# Patient Record
Sex: Female | Born: 2000 | Race: Black or African American | Hispanic: No | Marital: Single | Smoking: Former smoker
Health system: Southern US, Community
[De-identification: ages and names within clinical notes are randomized; demographics above are authoritative.]

## PROBLEM LIST (undated history)

## (undated) ENCOUNTER — Inpatient Hospital Stay (HOSPITAL_COMMUNITY): Payer: Self-pay

## (undated) DIAGNOSIS — J45909 Unspecified asthma, uncomplicated: Secondary | ICD-10-CM

## (undated) HISTORY — PX: TYMPANOSTOMY TUBE PLACEMENT: SHX32

## (undated) HISTORY — PX: TONSILLECTOMY: SUR1361

---
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
## 2019-02-21 ENCOUNTER — Encounter (HOSPITAL_COMMUNITY): Payer: Self-pay | Admitting: Emergency Medicine

## 2019-02-21 ENCOUNTER — Other Ambulatory Visit: Payer: Self-pay

## 2019-02-21 ENCOUNTER — Emergency Department (HOSPITAL_COMMUNITY)
Admission: EM | Admit: 2019-02-21 | Discharge: 2019-02-21 | Disposition: A | Discharge status: Home / Self Care | Payer: 59 | Attending: Emergency Medicine | Admitting: Emergency Medicine

## 2019-02-21 DIAGNOSIS — Z5321 Procedure and treatment not carried out due to patient leaving prior to being seen by health care provider: Secondary | ICD-10-CM | POA: Insufficient documentation

## 2019-02-21 DIAGNOSIS — R1032 Left lower quadrant pain: Secondary | ICD-10-CM | POA: Diagnosis present

## 2019-02-21 LAB — COMPREHENSIVE METABOLIC PANEL
ALT: 20 U/L (ref 0–44)
AST: 17 U/L (ref 15–41)
Albumin: 4.2 g/dL (ref 3.5–5.0)
Alkaline Phosphatase: 95 U/L (ref 38–126)
Anion gap: 7 (ref 5–15)
BUN: 14 mg/dL (ref 6–20)
CO2: 27 mmol/L (ref 22–32)
Calcium: 8.7 mg/dL — ABNORMAL LOW (ref 8.9–10.3)
Chloride: 106 mmol/L (ref 98–111)
Creatinine, Ser: 0.68 mg/dL (ref 0.44–1.00)
GFR calc Af Amer: >60 mL/min (ref >60)
GFR calc non Af Amer: >60 mL/min (ref >60)
Glucose, Bld: 110 mg/dL — ABNORMAL HIGH (ref 70–99)
Potassium: 3.6 mmol/L (ref 3.5–5.1)
Sodium: 140 mmol/L (ref 135–145)
Total Bilirubin: 0.9 mg/dL (ref 0.3–1.2)
Total Protein: 7 g/dL (ref 6.5–8.1)

## 2019-02-21 LAB — I-STAT BETA HCG BLOOD, ED (MC, WL, AP ONLY): I-stat hCG, quantitative: <5 m[IU]/mL (ref <5)

## 2019-02-21 LAB — CBC
HCT: 43 % (ref 36.0–46.0)
Hemoglobin: 14.1 g/dL (ref 12.0–15.0)
MCH: 29.8 pg (ref 26.0–34.0)
MCHC: 32.8 g/dL (ref 30.0–36.0)
MCV: 90.9 fL (ref 80.0–100.0)
Platelets: 198 10*3/uL (ref 150–400)
RBC: 4.73 MIL/uL (ref 3.87–5.11)
RDW: 13.5 % (ref 11.5–15.5)
WBC: 9.4 10*3/uL (ref 4.0–10.5)
nRBC: 0 % (ref 0.0–0.2)

## 2019-02-21 LAB — URINALYSIS, ROUTINE W REFLEX MICROSCOPIC
Bacteria, UA: NONE SEEN
Bilirubin Urine: NEGATIVE
Glucose, UA: NEGATIVE mg/dL
Hgb urine dipstick: NEGATIVE
Ketones, ur: NEGATIVE mg/dL
Leukocytes,Ua: NEGATIVE
Nitrite: NEGATIVE
Protein, ur: 30 mg/dL — AB
Specific Gravity, Urine: 1.034 — ABNORMAL HIGH (ref 1.005–1.030)
pH: 5 (ref 5.0–8.0)

## 2019-02-21 LAB — LIPASE, BLOOD: Lipase: 30 U/L (ref 11–51)

## 2019-02-21 NOTE — ED Triage Notes (Signed)
Per pt, states LL abdominal pain for 3 days-states pain is worse in the am-no N/V/D

## 2019-02-21 NOTE — ED Notes (Signed)
Pt called requesting lab results and voicing concerns about wait time.  Pt informed we cannot give results over the phone and attempted to educate on triage process.  Pt stated she had to leave d/t work schedule.  Pt asked about virtual visits and provided information.  Pt hung up on this Probation officer.

## 2019-02-21 NOTE — ED Notes (Signed)
Patient reports she is going to wait in her car as she does not want to wait in the waiting room due to [place].

## 2019-04-07 ENCOUNTER — Ambulatory Visit (HOSPITAL_COMMUNITY)
Admission: EM | Admit: 2019-04-07 | Discharge: 2019-04-07 | Disposition: A | Discharge status: Home / Self Care | Payer: 59 | Attending: Family Medicine | Admitting: Family Medicine

## 2019-04-07 ENCOUNTER — Encounter (HOSPITAL_COMMUNITY): Payer: Self-pay

## 2019-04-07 ENCOUNTER — Other Ambulatory Visit: Payer: Self-pay

## 2019-04-07 DIAGNOSIS — Z113 Encounter for screening for infections with a predominantly sexual mode of transmission: Secondary | ICD-10-CM | POA: Diagnosis present

## 2019-04-07 DIAGNOSIS — J4520 Mild intermittent asthma, uncomplicated: Secondary | ICD-10-CM | POA: Diagnosis present

## 2019-04-07 DIAGNOSIS — Z760 Encounter for issue of repeat prescription: Secondary | ICD-10-CM | POA: Diagnosis not present

## 2019-04-07 DIAGNOSIS — Z8619 Personal history of other infectious and parasitic diseases: Secondary | ICD-10-CM

## 2019-04-07 MED ORDER — ALBUTEROL SULFATE HFA 108 (90 BASE) MCG/ACT IN AERS: 1.0000 | INHALATION_SPRAY | Freq: Four times a day (QID) | RESPIRATORY_TRACT | 0 refills | Status: DC | PRN

## 2019-04-07 NOTE — Discharge Instructions (Signed)
Inhaler refilled  We are testing you for Gonorrhea, Chlamydia, Trichomonas, Yeast and Bacterial Vaginosis. We will call you if anything is positive and let you know if you require any further treatment. Please inform partners of any positive results.   Please return if symptoms not improving with treatment, development of fever, nausea, vomiting, abdominal pain.

## 2019-04-07 NOTE — ED Triage Notes (Signed)
Pt needs a refill on her inhaler and would like to have a STD screening. Pt denies any symptoms at this time but would like to be tested.

## 2019-04-07 NOTE — ED Provider Notes (Signed)
[facility]    CSN: [id number] Arrival date & time: 04/07/19  1535      History   Chief Complaint Chief Complaint  Patient presents with   Medication Refill   Exposure to STD    HPI Patricia [name] is a 19 y.o. female history of asthma presenting today for medication refill and STD screening.  Patient states that she feels her asthma has been flared up recently and has ran out of her albuterol inhaler.  She is requesting a refill of this.  She notes that with the weather changing she has been more congested in the morning with occasional shortness of breath and wheezing.  Symptoms are intermittent typically improved with albuterol inhaler.  She denies any fevers chills or body aches.  Denies any close sick contacts or known Covid exposure.  Feels similar to prior asthma flares.  She also is requesting STD screening.  She notes that previously when she had chlamydia she had menstrual cycles that would last longer than normal.  Of recently she has noticed increased bleeding and cycles.  Patient uses Nexplanon for birth control and notes that it is normal to have irregular cycles, but she recently had an episode of bleeding that lasted approximately 5 days and stopped and then has recently returned.  She denies any abnormal discharge.  Denies any pelvic pain.  Denies any known exposures.  Denies any fevers nausea or vomiting.  HPI  Past Medical History:  Diagnosis Date   Asthma     There are no problems to display for this patient.   Past Surgical History:  Procedure Laterality Date   TONSILLECTOMY     TYMPANOSTOMY TUBE PLACEMENT      OB History   No obstetric history on file.      Home Medications    Prior to Admission medications   Medication Sig Start Date End Date Taking? Authorizing Provider  albuterol (PROVENTIL) (2.5 MG/3ML) 0.083% nebulizer solution Take 3 mLs (2.5 mg total) by nebulization every 6 (six) hours as needed for up to 5 days for  wheezing or shortness of breath. 04/15/17 12/19/27  Elowen Debruyn C, PA-C  albuterol (VENTOLIN HFA) 108 (90 Base) MCG/ACT inhaler Inhale 1-2 puffs into the lungs every 6 (six) hours as needed for wheezing or shortness of breath. 04/07/19   Darron Stuck C, PA-C  Etonogestrel (NEXPLANON [place]) Inject into the skin once.     [provider]  fluticasone (FLONASE) 50 MCG/ACT nasal spray Place 2 sprays into both nostrils daily. Patient taking differently: Place 2 sprays into both nostrils daily as needed for allergies.  06/02/17   Tasia Catchings, Amy V, PA-C  loratadine (CLARITIN) 10 MG tablet Take 1 tablet (10 mg total) by mouth daily. Patient taking differently: Take 10 mg by mouth daily as needed for allergies.  12/07/11 12/19/27  Awilda Metro, NP    Family History History reviewed. No pertinent family history.  Social History Social History   Tobacco Use   Smoking status: Never Smoker   Smokeless tobacco: Never Used  Substance Use Topics   Alcohol use: No   Drug use: No     Allergies   Patient has no known allergies.   Review of Systems Review of Systems  Constitutional: Negative for activity change, appetite change, chills, fatigue and fever.  HENT: Positive for congestion. Negative for ear pain, rhinorrhea, sinus pressure, sore throat and trouble swallowing.   Eyes: Negative for discharge and redness.  Respiratory: Positive for cough, shortness  of breath and wheezing. Negative for chest tightness.   Cardiovascular: Negative for chest pain.  Gastrointestinal: Negative for abdominal pain, diarrhea, nausea and vomiting.  Genitourinary: Positive for menstrual problem. Negative for dysuria, flank pain, genital sores, hematuria, vaginal bleeding, vaginal discharge and vaginal pain.  Musculoskeletal: Negative for back pain and myalgias.  Skin: Negative for rash.  Neurological: Negative for dizziness, light-headedness and headaches.     Physical Exam Triage Vital Signs ED Triage  Vitals [04/07/19 1602]  Enc Vitals Group     BP 122/74     Pulse Rate 77     Resp 16     Temp 98.1 F (36.7 C)     Temp Source Oral     SpO2 98 %     Weight      Height      Head Circumference      Peak Flow      Pain Score 0     Pain Loc      Pain Edu?      Excl. in GC?    No data found.  Updated Vital Signs BP 122/74 (BP Location: Left Arm)    Pulse 77    Temp 98.1 F (36.7 C) (Oral)    Resp 16    SpO2 98%   Visual Acuity Right Eye Distance:   Left Eye Distance:   Bilateral Distance:    Right Eye Near:   Left Eye Near:    Bilateral Near:     Physical Exam Vitals and nursing note reviewed.  Constitutional:      General: She is not in acute distress.    Appearance: She is well-developed.  HENT:     Head: Normocephalic and atraumatic.     Ears:     Comments: Bilateral ears without tenderness to palpation of external auricle, tragus and mastoid, EAC's without erythema or swelling, TM's with good bony landmarks and cone of light. Non erythematous.    Mouth/Throat:     Comments: Oral mucosa pink and moist, no tonsillar enlargement or exudate. Posterior pharynx patent and nonerythematous, no uvula deviation or swelling. Normal phonation. Eyes:     Conjunctiva/sclera: Conjunctivae normal.  Cardiovascular:     Rate and Rhythm: Normal rate and regular rhythm.     Heart sounds: No murmur.  Pulmonary:     Effort: Pulmonary effort is normal. No respiratory distress.     Breath sounds: Normal breath sounds.     Comments: Breathing comfortably at rest, CTABL, no wheezing, rales or other adventitious sounds auscultated Abdominal:     Palpations: Abdomen is soft.     Tenderness: There is no abdominal tenderness.  Genitourinary:    Comments: Deferred Musculoskeletal:     Cervical back: Neck supple.  Skin:    General: Skin is warm and dry.  Neurological:     Mental Status: She is alert.      UC Treatments / Results  Labs (all labs ordered are listed, but only  abnormal results are displayed) Labs Reviewed  CERVICOVAGINAL ANCILLARY ONLY    EKG   Radiology No results found.  Procedures Procedures (including critical care time)  Medications Ordered in UC Medications - No data to display  Initial Impression / Assessment and Plan / UC Course  I have reviewed the triage vital signs and the nursing notes.  Pertinent labs & imaging results that were available during my care of the patient were reviewed by me and considered in my medical decision making (see chart  for details).     Refilling albuterol inhaler, will defer steroids at this time as it is currently being controlled with inhaler alone.  Advised patient to continue to monitor.  Vaginal swab obtained to send off for STD screening.  Deferring any empiric treatment at this time.  Will call with results and provide treatment as necessary.  Discussed strict return precautions. Patient verbalized understanding and is agreeable with plan.    Final Clinical Impressions(s) / UC Diagnoses   Final diagnoses:  Medication refill  Mild intermittent asthma without complication  Screen for STD (sexually transmitted disease)     Discharge Instructions     Inhaler refilled  We are testing you for Gonorrhea, Chlamydia, Trichomonas, Yeast and Bacterial Vaginosis. We will call you if anything is positive and let you know if you require any further treatment. Please inform partners of any positive results.   Please return if symptoms not improving with treatment, development of fever, nausea, vomiting, abdominal pain.    ED Prescriptions    Medication Sig Dispense Auth. Provider   albuterol (VENTOLIN HFA) 108 (90 Base) MCG/ACT inhaler Inhale 1-2 puffs into the lungs every 6 (six) hours as needed for wheezing or shortness of breath. 18 g [place] Borchardt, [place] C, PA-C     PDMP not reviewed this encounter.   Lew Dawes, [place] 04/07/19 1949

## 2019-04-11 ENCOUNTER — Telehealth (HOSPITAL_COMMUNITY): Payer: Self-pay | Admitting: Emergency Medicine

## 2019-04-11 LAB — CERVICOVAGINAL ANCILLARY ONLY
Bacterial vaginitis: POSITIVE — AB
Candida vaginitis: NEGATIVE
Chlamydia: NEGATIVE
Neisseria Gonorrhea: NEGATIVE
Trichomonas: NEGATIVE

## 2019-04-11 MED ORDER — METRONIDAZOLE 500 MG PO TABS: 500.0000 mg | ORAL_TABLET | Freq: Two times a day (BID) | ORAL | 0 refills | Status: AC

## 2019-04-11 NOTE — Telephone Encounter (Signed)
Bacterial vaginosis is positive. Pt needs treatment. metrogel sent to patients pharmacy of choice.   Patient contacted by phone and made aware of    results. Pt verbalized understanding and had all questions answered.    Pt ultimately decided on pills.

## 2019-06-05 ENCOUNTER — Encounter (HOSPITAL_COMMUNITY): Payer: Self-pay | Admitting: *Deleted

## 2019-06-05 ENCOUNTER — Ambulatory Visit (HOSPITAL_COMMUNITY)
Admission: EM | Admit: 2019-06-05 | Discharge: 2019-06-05 | Disposition: A | Discharge status: Home / Self Care | Payer: 59 | Attending: Urgent Care | Admitting: Urgent Care

## 2019-06-05 ENCOUNTER — Other Ambulatory Visit: Payer: Self-pay

## 2019-06-05 DIAGNOSIS — J4530 Mild persistent asthma, uncomplicated: Secondary | ICD-10-CM | POA: Diagnosis not present

## 2019-06-05 DIAGNOSIS — Z79899 Other long term (current) drug therapy: Secondary | ICD-10-CM | POA: Insufficient documentation

## 2019-06-05 DIAGNOSIS — Z20822 Contact with and (suspected) exposure to covid-19: Secondary | ICD-10-CM | POA: Diagnosis not present

## 2019-06-05 DIAGNOSIS — R05 Cough: Secondary | ICD-10-CM | POA: Diagnosis present

## 2019-06-05 DIAGNOSIS — R062 Wheezing: Secondary | ICD-10-CM

## 2019-06-05 DIAGNOSIS — R0602 Shortness of breath: Secondary | ICD-10-CM | POA: Diagnosis not present

## 2019-06-05 DIAGNOSIS — R059 Cough, unspecified: Secondary | ICD-10-CM

## 2019-06-05 DIAGNOSIS — J069 Acute upper respiratory infection, unspecified: Secondary | ICD-10-CM | POA: Insufficient documentation

## 2019-06-05 DIAGNOSIS — J45998 Other asthma: Secondary | ICD-10-CM

## 2019-06-05 MED ORDER — ALBUTEROL SULFATE (2.5 MG/3ML) 0.083% IN NEBU: 2.5000 mg | INHALATION_SOLUTION | Freq: Four times a day (QID) | RESPIRATORY_TRACT | 0 refills | Status: DC | PRN

## 2019-06-05 MED ORDER — BENZONATATE 100 MG PO CAPS: 100.0000 mg | ORAL_CAPSULE | Freq: Three times a day (TID) | ORAL | 0 refills | Status: DC | PRN

## 2019-06-05 MED ORDER — NEBULIZER SYSTEM ALL-IN-ONE MISC: 1.0000 "application " | 0 refills | Status: DC | PRN

## 2019-06-05 MED ORDER — PROMETHAZINE-DM 6.25-15 MG/5ML PO SYRP: 5.0000 mL | ORAL_SOLUTION | Freq: Every evening | ORAL | 0 refills | Status: DC | PRN

## 2019-06-05 NOTE — ED Triage Notes (Addendum)
Patient reports waking up this am short of breath, history of asthma. Patient reports using albuterol inhaler earlier today with no relief. Patient talking in complete sentences. Patient does not have neb machine or meds for it.

## 2019-06-05 NOTE — Discharge Instructions (Signed)

## 2019-06-05 NOTE — ED Provider Notes (Signed)
[facility]   MRN: [id number] DOB: 2000-04-15  Subjective:   Patricia [name] is a 19 y.o. female presenting for 1 day history of dry bothersome cough, shortness of breath and wheezing that persisted into this morning.  Patient has a history of asthma, had to use her inhaler with some relief this morning.  She does not have a nebulizer machine anymore, lost this when she moved.  Not established care with a new PCP since having to leave her pediatrician.  Patient works at [employer] at the [employer], has had no direct/known Covid exposure.  No current facility-administered medications for this encounter.  Current Outpatient Medications:  .  albuterol (VENTOLIN HFA) 108 (90 Base) MCG/ACT inhaler, Inhale 1-2 puffs into the lungs every 6 (six) hours as needed for wheezing or shortness of breath., Disp: 18 g, Rfl: 0 .  albuterol (PROVENTIL) (2.5 MG/3ML) 0.083% nebulizer solution, Take 3 mLs (2.5 mg total) by nebulization every 6 (six) hours as needed for up to 5 days for wheezing or shortness of breath., Disp: 75 mL, Rfl: 0 .  benzonatate (TESSALON) 100 MG capsule, Take 1-2 capsules (100-200 mg total) by mouth 3 (three) times daily as needed., Disp: 60 capsule, Rfl: 0 .  Etonogestrel (NEXPLANON [place]), Inject into the skin once. , Disp: , Rfl:  .  fluticasone (FLONASE) 50 MCG/ACT nasal spray, Place 2 sprays into both nostrils daily. (Patient taking differently: Place 2 sprays into both nostrils daily as needed for allergies. ), Disp: 1 g, Rfl: 0 .  loratadine (CLARITIN) 10 MG tablet, Take 1 tablet (10 mg total) by mouth daily. (Patient taking differently: Take 10 mg by mouth daily as needed for allergies. ), Disp: 30 tablet, Rfl: 1 .  Nebulizer System All-In-One MISC, 1 application by Does not apply route every 4 (four) hours as needed. Use with nebulized albuterol., Disp: 1 each, Rfl: 0 .  promethazine-dextromethorphan (PROMETHAZINE-DM) 6.25-15 MG/5ML syrup, Take 5 mLs by mouth at  bedtime as needed for cough., Disp: 100 mL, Rfl: 0   No Known Allergies  Past Medical History:  Diagnosis Date  . Asthma      Past Surgical History:  Procedure Laterality Date  . TONSILLECTOMY    . TYMPANOSTOMY TUBE PLACEMENT      History reviewed. No pertinent family history.  Social History   Tobacco Use  . Smoking status: Never Smoker  . Smokeless tobacco: Never Used  Substance Use Topics  . Alcohol use: No  . Drug use: No    ROS   Objective:   Vitals: Pulse 83   Temp 98.5 F (36.9 C) (Oral)   Resp 18   SpO2 99%   Physical Exam Constitutional:      General: She is not in acute distress.    Appearance: Normal appearance. She is well-developed. She is not ill-appearing, toxic-appearing or diaphoretic.  HENT:     Head: Normocephalic and atraumatic.     Nose: Nose normal.     Mouth/Throat:     Mouth: Mucous membranes are moist.     Pharynx: Oropharynx is clear.  Eyes:     General: No scleral icterus.    Extraocular Movements: Extraocular movements intact.     Pupils: Pupils are equal, round, and reactive to light.  Cardiovascular:     Rate and Rhythm: Normal rate and regular rhythm.     Pulses: Normal pulses.     Heart sounds: Normal heart sounds. No murmur. No friction rub. No gallop.  Pulmonary:     Effort: Pulmonary effort is normal. No respiratory distress.     Breath sounds: Normal breath sounds. No stridor. No wheezing, rhonchi or rales.  Skin:    General: Skin is warm and dry.     Findings: No rash.  Neurological:     General: No focal deficit present.     Mental Status: She is alert and oriented to person, place, and time.  Psychiatric:        Mood and Affect: Mood normal.        Behavior: Behavior normal.        Thought Content: Thought content normal.        Judgment: Judgment normal.     Assessment and Plan :   1. Viral upper respiratory tract infection   2. Asthma, persistent controlled   3. Wheezing   4. Shortness of breath     5. Cough     Refilled her nebulized albuterol, provided her with prescription for nebulizer machine.  Hold off on steroid course for now given reassuring physical exam findings and vital signs. Will manage for viral illness such as viral URI, viral rhinitis, possible COVID-19. Counseled patient on nature of COVID-19 including modes of transmission, diagnostic testing, management and supportive care.  Offered symptomatic relief. COVID 19 testing is pending. Counseled patient on potential for adverse effects with medications prescribed/recommended today, ER and return-to-clinic precautions discussed, patient verbalized understanding.     Wallis Bamberg, [place] 06/05/19 0923

## 2019-06-07 LAB — NOVEL CORONAVIRUS, NAA (HOSP ORDER, SEND-OUT TO REF LAB; TAT 18-24 HRS): SARS-CoV-2, NAA: NOT DETECTED

## 2019-07-21 ENCOUNTER — Other Ambulatory Visit: Payer: Self-pay

## 2019-07-21 ENCOUNTER — Encounter (HOSPITAL_COMMUNITY): Payer: Self-pay

## 2019-07-21 ENCOUNTER — Ambulatory Visit (HOSPITAL_COMMUNITY)
Admission: EM | Admit: 2019-07-21 | Discharge: 2019-07-21 | Disposition: A | Discharge status: Home / Self Care | Payer: 59 | Attending: Family Medicine | Admitting: Family Medicine

## 2019-07-21 DIAGNOSIS — J309 Allergic rhinitis, unspecified: Secondary | ICD-10-CM

## 2019-07-21 DIAGNOSIS — H66002 Acute suppurative otitis media without spontaneous rupture of ear drum, left ear: Secondary | ICD-10-CM

## 2019-07-21 MED ORDER — CETIRIZINE HCL 10 MG PO TABS: 10.0000 mg | ORAL_TABLET | Freq: Every day | ORAL | 0 refills | Status: DC

## 2019-07-21 MED ORDER — AMOXICILLIN 875 MG PO TABS
875.0000 mg | ORAL_TABLET | Freq: Two times a day (BID) | ORAL | 0 refills | Status: AC
Start: 2019-07-21 — End: 2019-07-31

## 2019-07-21 NOTE — ED Triage Notes (Signed)
Pt presents with left side ear pain and sore throat X 3 days.

## 2019-07-21 NOTE — ED Provider Notes (Signed)
[place]   [id number] 07/21/19 Arrival Time: 4098  ASSESSMENT & PLAN:  1. Non-recurrent acute suppurative otitis media of left ear without spontaneous rupture of tympanic membrane   2. Allergic rhinitis, unspecified seasonality, unspecified trigger     Begin: Meds ordered this encounter  Medications  . cetirizine (ZYRTEC ALLERGY) 10 MG tablet    Sig: Take 1 tablet (10 mg total) by mouth daily.    Dispense:  30 tablet    Refill:  0  . amoxicillin (AMOXIL) 875 MG tablet    Sig: Take 1 tablet (875 mg total) by mouth 2 (two) times daily for 10 days.    Dispense:  20 tablet    Refill:  0    OTC symptom care as needed. May f/u with PCP or here as needed.  Reviewed expectations re: course of current medical issues. Questions answered. Outlined signs and symptoms indicating need for more acute intervention. Patient verbalized understanding. After Visit Summary given.   SUBJECTIVE: History from: patient.  Patricia [name] is a 19 y.o. female who presents with complaint of left otalgia; without drainage; without bleeding. Onset gradual, first noted approx 3 d ago. Recent cold symptoms: nasal congestion and eye itching. Questions related to pollen. Fever: no. Overall normal PO intake without n/v. Sick contacts: no. OTC treatment: none reported. Reports h/o frequent ear infections as child.  Social History   Tobacco Use  Smoking Status Never Smoker  Smokeless Tobacco Never Used     OBJECTIVE:  Vitals:   07/21/19 1440  BP: 122/81  Pulse: 85  Resp: 18  Temp: 98.4 F (36.9 C)  TempSrc: Oral  SpO2: 100%     General appearance: alert; NAD Ear Canal: normal TM: left: erythematous, bulging Neck: supple without LAD Lungs: unlabored respirations, speaks full sentences without difficulty Skin: warm and dry Psychological: alert and cooperative; normal mood and affect  No Known Allergies  Past Medical History:  Diagnosis Date  . Asthma    History  reviewed. No pertinent family history. Social History   Socioeconomic History  . Marital status: Single    Spouse name: Not on file  . Number of children: Not on file  . Years of education: Not on file  . Highest education level: Not on file  Occupational History  . Not on file  Tobacco Use  . Smoking status: Never Smoker  . Smokeless tobacco: Never Used  Substance and Sexual Activity  . Alcohol use: No  . Drug use: No  . Sexual activity: Yes    Birth control/protection: None  Other Topics Concern  . Not on file  Social History Narrative  . Not on file   Social Determinants of Health   Financial Resource Strain:   . Difficulty of Paying Living Expenses:   Food Insecurity:   . Worried About Charity fundraiser in the Last Year:   . Arboriculturist in the Last Year:   Transportation Needs:   . Film/video editor (Medical):   Marland Kitchen Lack of Transportation (Non-Medical):   Physical Activity:   . Days of Exercise per Week:   . Minutes of Exercise per Session:   Stress:   . Feeling of Stress :   Social Connections:   . Frequency of Communication with Friends and Family:   . Frequency of Social Gatherings with Friends and Family:   . Attends Religious Services:   . Active Member of Clubs or Organizations:   . Attends Archivist Meetings:   .  Marital Status:   Intimate Partner Violence:   . Fear of Current or Ex-Partner:   . Emotionally Abused:   Marland Kitchen Physically Abused:   . Sexually Abused:             Mardella Layman, MD 07/21/19 [phone]

## 2019-07-25 ENCOUNTER — Encounter (HOSPITAL_COMMUNITY): Payer: Self-pay | Admitting: Emergency Medicine

## 2019-07-25 ENCOUNTER — Emergency Department (HOSPITAL_COMMUNITY)
Admission: EM | Admit: 2019-07-25 | Discharge: 2019-07-25 | Disposition: A | Discharge status: Home / Self Care | Payer: 59 | Attending: Emergency Medicine | Admitting: Emergency Medicine

## 2019-07-25 ENCOUNTER — Other Ambulatory Visit: Payer: Self-pay

## 2019-07-25 DIAGNOSIS — Y999 Unspecified external cause status: Secondary | ICD-10-CM | POA: Diagnosis not present

## 2019-07-25 DIAGNOSIS — R519 Headache, unspecified: Secondary | ICD-10-CM | POA: Insufficient documentation

## 2019-07-25 DIAGNOSIS — Y929 Unspecified place or not applicable: Secondary | ICD-10-CM | POA: Diagnosis not present

## 2019-07-25 DIAGNOSIS — Y939 Activity, unspecified: Secondary | ICD-10-CM | POA: Diagnosis not present

## 2019-07-25 DIAGNOSIS — S0990XA Unspecified injury of head, initial encounter: Secondary | ICD-10-CM | POA: Insufficient documentation

## 2019-07-25 MED ORDER — METHOCARBAMOL 500 MG PO TABS
500.0000 mg | ORAL_TABLET | Freq: Two times a day (BID) | ORAL | 0 refills | Status: DC
Start: 2019-07-25 — End: 2020-10-24

## 2019-07-25 MED ORDER — IBUPROFEN 400 MG PO TABS
600.0000 mg | ORAL_TABLET | Freq: Once | ORAL | Status: AC
  Administered 2019-07-25: 600 mg via ORAL
  Filled 2019-07-25: qty 1

## 2019-07-25 MED ORDER — LIDOCAINE 5 % EX PTCH: 1.0000 | MEDICATED_PATCH | CUTANEOUS | 0 refills | Status: DC

## 2019-07-25 NOTE — Discharge Instructions (Addendum)
Expect your soreness to increase over the next 2-3 days. Take it easy, but do not lay around too much as this may make any stiffness worse.  Antiinflammatory medications: Take 600 mg of ibuprofen every 6 hours or 440 mg (over the counter dose) to 500 mg (prescription dose) of naproxen every 12 hours for the next 3 days. After this time, these medications may be used as needed for pain. Take these medications with food to avoid upset stomach. Choose only one of these medications, do not take them together. Acetaminophen (generic for Tylenol): Should you continue to have additional pain while taking the ibuprofen or naproxen, you may add in acetaminophen as needed. Your daily total maximum amount of acetaminophen from all sources should be limited to 4000mg /day for persons without liver problems, or 2000mg /day for those with liver problems. Methocarbamol: Methocarbamol (generic for Robaxin) is a muscle relaxer and can help relieve stiff muscles or muscle spasms.  Do not drive or perform other dangerous activities while taking this medication as it can cause drowsiness as well as changes in reaction time and judgement. Lidocaine patches: These are available via either prescription or over-the-counter. The over-the-counter option may be more economical one and are likely just as effective. There are multiple over-the-counter brands, such as Salonpas. Ice: May apply ice to the area over the next 24 hours for 15 minutes at a time to reduce pain, inflammation, and swelling, if present. Exercises: Be sure to perform the attached exercises starting with three times a week and working up to performing them daily. This is an essential part of preventing long term problems.  Follow up: Follow up with a primary care provider for any future management of these complaints. Be sure to follow up within 7-10 days. Return: Return to the ED should symptoms worsen.  For prescription assistance, may try using prescription  discount sites or apps, such as goodrx.com   Head Injury You have been seen today for a head injury, which may or may not have resulted in a concussion. It does not appear to be serious at this time, however, it is important to note that your presentation today is not necessarily an indication of the severity of future symptoms.  Expected symptoms: Expected symptoms of concussion and/or head injury can include nausea, headache, mild dizziness (should still be able to get up and walk around without difficulty), difficulty concentrating, increased sleep, difficulty sleeping, increased intensity of emotions. Close observation: The close observation period is usually 6 hours from the injury. This includes staying awake and having a trustworthy adult monitor you to assure your condition does not worsen. You should be in regular contact with this person and ideally, they should be able to monitor you in person.  Secondary observation: The secondary observation period is usually 24 hours from the injury. You are allowed to sleep during this time. A trustworthy adult should intermittently monitor you to assure your condition does not worsen.   Overall head injury/concussion care: Rest: Be sure to get plenty of rest. You will need more rest and sleep while you recover. Hydration: Be sure to stay well hydrated by having a goal of drinking about 0.5 liters of water an hour. Pain:  Antiinflammatory medications: Take 600 mg of ibuprofen every 6 hours or 440 mg (over the counter dose) to 500 mg (prescription dose) of naproxen every 12 hours or for the next 3 days. After this time, these medications may be used as needed for pain. Take these medications with  food to avoid upset stomach. Choose only one of these medications, do not take them together. Tylenol: Should you continue to have additional pain while taking the ibuprofen or naproxen, you may add in tylenol as needed. Your daily total maximum amount of tylenol  from all sources should be limited to 4000mg /day for persons without liver problems, or 2000mg /day for those with liver problems. Return to sports and activities: In general, you may return to normal activities once symptoms have subsided, however, you would ideally be cleared by a primary care provider or other qualified medical professional prior to return to these activities.  Follow up: Follow up with the concussion clinic or your primary care provider for further management of this issue. Return: Return to the ED should you begin to have confusion, abnormal behavior, aggression, violence, or personality changes, repeated vomiting, vision loss, numbness or weakness on one side of the body, difficulty standing due to dizziness, significantly worsening pain, or any other major concerns.
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
## 2019-09-18 ENCOUNTER — Other Ambulatory Visit: Payer: Self-pay

## 2019-09-18 ENCOUNTER — Encounter (HOSPITAL_COMMUNITY): Payer: Self-pay

## 2019-09-18 ENCOUNTER — Ambulatory Visit (HOSPITAL_COMMUNITY)
Admission: EM | Admit: 2019-09-18 | Discharge: 2019-09-18 | Disposition: A | Discharge status: Home / Self Care | Payer: 59 | Attending: Family Medicine | Admitting: Family Medicine

## 2019-09-18 DIAGNOSIS — J45909 Unspecified asthma, uncomplicated: Secondary | ICD-10-CM

## 2019-09-18 MED ORDER — ALBUTEROL SULFATE HFA 108 (90 BASE) MCG/ACT IN AERS: 1.0000 | INHALATION_SPRAY | Freq: Four times a day (QID) | RESPIRATORY_TRACT | 0 refills | Status: DC | PRN

## 2019-09-18 NOTE — ED Provider Notes (Signed)
[place]    CSN: [id number] Arrival date & time: 09/18/19  [place]      History   Chief Complaint Chief Complaint  Patient presents with  . Cough  . Wheezing    HPI Patricia [name] is a 19 y.o. female.   She is presenting with asthma.  She is not have any wheezing or shortness of breath.  She has been using albuterol and has run out of her medication.  She has not had any fevers.  Denies any recent illness or exposure anyone similar symptoms.  She does smoke on a regular basis and has been trying to cut back.  HPI  Past Medical History:  Diagnosis Date  . Asthma     There are no problems to display for this patient.   Past Surgical History:  Procedure Laterality Date  . TONSILLECTOMY    . TYMPANOSTOMY TUBE PLACEMENT      OB History   No obstetric history on file.      Home Medications    Prior to Admission medications   Medication Sig Start Date End Date Taking? Authorizing Provider  albuterol (VENTOLIN HFA) 108 (90 Base) MCG/ACT inhaler Inhale 1-2 puffs into the lungs every 6 (six) hours as needed for wheezing or shortness of breath. 09/18/19   Rosemarie Ax, MD  benzonatate (TESSALON) 100 MG capsule Take 1-2 capsules (100-200 mg total) by mouth 3 (three) times daily as needed. Patient not taking: Reported on 07/25/2019 06/05/19   Jaynee Eagles, PA-C  cetirizine (ZYRTEC ALLERGY) 10 MG tablet Take 1 tablet (10 mg total) by mouth daily. 07/21/19   Vanessa Kick, MD  etonogestrel (NEXPLANON) 68 MG IMPL implant 1 each by Subdermal route once.    [provider]  FLUoxetine (PROZAC) 10 MG capsule Take 10 mg by mouth daily.     [provider]  fluticasone (FLONASE) 50 MCG/ACT nasal spray Place 2 sprays into both nostrils daily. Patient not taking: Reported on 07/25/2019 06/02/17   Ok Edwards, PA-C  lidocaine (LIDODERM) 5 % Place 1 patch onto the skin daily. Remove & Discard patch within 12 hours or as directed by MD 07/25/19   Joy, Shawn C, PA-C    loratadine (CLARITIN) 10 MG tablet Take 1 tablet (10 mg total) by mouth daily. Patient not taking: Reported on 07/25/2019 12/07/11 12/19/27  Awilda Metro, NP  methocarbamol (ROBAXIN) 500 MG tablet Take 1 tablet (500 mg total) by mouth 2 (two) times daily. 07/25/19   Joy, Helane Gunther, PA-C  Nebulizer System All-In-One MISC 1 application by Does not apply route every 4 (four) hours as needed. Use with nebulized albuterol. 06/05/19   Jaynee Eagles, PA-C  promethazine-dextromethorphan (PROMETHAZINE-DM) 6.25-15 MG/5ML syrup Take 5 mLs by mouth at bedtime as needed for cough. Patient not taking: Reported on 07/25/2019 06/05/19   Jaynee Eagles, PA-C    Family History History reviewed. No pertinent family history.  Social History Social History   Tobacco Use  . Smoking status: Never Smoker  . Smokeless tobacco: Never Used  Substance Use Topics  . Alcohol use: No  . Drug use: No     Allergies   Patient has no known allergies.   Review of Systems Review of Systems  See HPI   Physical Exam Triage Vital Signs ED Triage Vitals  Enc Vitals Group     BP 09/18/19 2009 103/66     Pulse Rate 09/18/19 2009 73     Resp 09/18/19 2009 19  Temp 09/18/19 2009 98.2 F (36.8 C)     Temp Source 09/18/19 2009 Oral     SpO2 09/18/19 2009 100 %     Weight --      Height --      Head Circumference --      Peak Flow --      Pain Score 09/18/19 2006 0     Pain Loc --      Pain Edu? --      Excl. in GC? --    No data found.  Updated Vital Signs BP 103/66 (BP Location: Right Arm)   Pulse 73   Temp 98.2 F (36.8 C) (Oral)   Resp 19   LMP  (Within Weeks) Comment: 2 weeks  SpO2 100%   Visual Acuity Right Eye Distance:   Left Eye Distance:   Bilateral Distance:    Right Eye Near:   Left Eye Near:    Bilateral Near:     Physical Exam Gen: NAD, alert, cooperative with exam, well-appearing ENT: normal lips, normal nasal mucosa,  Eye: normal EOM, normal conjunctiva and lids  Resp: no  accessory muscle use, non-labored,  Skin: no rashes, no areas of induration  Neuro: normal tone, normal sensation to touch Psych:  normal insight, alert and oriented    UC Treatments / Results  Labs (all labs ordered are listed, but only abnormal results are displayed) Labs Reviewed - No data to display  EKG   Radiology No results found.  Procedures Procedures (including critical care time)  Medications Ordered in UC Medications - No data to display  Initial Impression / Assessment and Plan / UC Course  I have reviewed the triage vital signs and the nursing notes.  Pertinent labs & imaging results that were available during my care of the patient were reviewed by me and considered in my medical decision making (see chart for details).     [name] is a 18 year old female that is presenting with asthma.  Does not seem to be having an exacerbation currently.  No wheezing on exam.  Provided refill of albuterol.  Counseled on smoking cessation.  Counseled supportive care.  Given indications to follow-up.  Final Clinical Impressions(s) / UC Diagnoses   Final diagnoses:  Mild asthma, unspecified whether complicated, unspecified whether persistent     Discharge Instructions     Please use the albuterol as needed  Please try an allergy medication such as zyrtec  Please follow up if your symptoms fail to improve.     ED Prescriptions    Medication Sig Dispense Auth. Provider   albuterol (VENTOLIN HFA) 108 (90 Base) MCG/ACT inhaler Inhale 1-2 puffs into the lungs every 6 (six) hours as needed for wheezing or shortness of breath. 18 g Myra Rude, MD     PDMP not reviewed this encounter.   Myra Rude, MD 09/18/19 2134

## 2019-09-18 NOTE — Discharge Instructions (Signed)
Please use the albuterol as needed  Please try an allergy medication such as zyrtec  Please follow up if your symptoms fail to improve.

## 2019-09-18 NOTE — ED Triage Notes (Addendum)
Pt presents to UC with cough and wheezing x 3 days.   Pt do not want COVID test.

## 2019-12-21 ENCOUNTER — Ambulatory Visit (HOSPITAL_COMMUNITY)
Admission: EM | Admit: 2019-12-21 | Discharge: 2019-12-21 | Disposition: A | Discharge status: Home / Self Care | Payer: 59 | Attending: Urgent Care | Admitting: Urgent Care

## 2019-12-21 ENCOUNTER — Encounter (HOSPITAL_COMMUNITY): Payer: Self-pay | Admitting: Emergency Medicine

## 2019-12-21 ENCOUNTER — Other Ambulatory Visit: Payer: Self-pay

## 2019-12-21 DIAGNOSIS — Z87440 Personal history of urinary (tract) infections: Secondary | ICD-10-CM | POA: Diagnosis present

## 2019-12-21 DIAGNOSIS — R300 Dysuria: Secondary | ICD-10-CM | POA: Insufficient documentation

## 2019-12-21 DIAGNOSIS — N12 Tubulo-interstitial nephritis, not specified as acute or chronic: Secondary | ICD-10-CM | POA: Insufficient documentation

## 2019-12-21 DIAGNOSIS — R109 Unspecified abdominal pain: Secondary | ICD-10-CM | POA: Insufficient documentation

## 2019-12-21 DIAGNOSIS — R350 Frequency of micturition: Secondary | ICD-10-CM | POA: Diagnosis present

## 2019-12-21 DIAGNOSIS — Z3202 Encounter for pregnancy test, result negative: Secondary | ICD-10-CM | POA: Diagnosis not present

## 2019-12-21 LAB — POCT URINALYSIS DIPSTICK, ED / UC
Bilirubin Urine: NEGATIVE
Glucose, UA: NEGATIVE mg/dL
Ketones, ur: NEGATIVE mg/dL
Nitrite: NEGATIVE
Protein, ur: 30 mg/dL — AB
Specific Gravity, Urine: 1.02 (ref 1.005–1.030)
Urobilinogen, UA: 2 mg/dL — ABNORMAL HIGH (ref 0.0–1.0)
pH: 6.5 (ref 5.0–8.0)

## 2019-12-21 LAB — POC URINE PREG, ED: Preg Test, Ur: NEGATIVE

## 2019-12-21 MED ORDER — CEFTRIAXONE SODIUM 1 G IJ SOLR
1.0000 g | Freq: Once | INTRAMUSCULAR | Status: AC
  Administered 2019-12-21: 1 g via INTRAMUSCULAR

## 2019-12-21 MED ORDER — STERILE WATER FOR INJECTION IJ SOLN
INTRAMUSCULAR | Status: AC
  Filled 2019-12-21: qty 10

## 2019-12-21 MED ORDER — CEPHALEXIN 500 MG PO CAPS
500.0000 mg | ORAL_CAPSULE | Freq: Two times a day (BID) | ORAL | 0 refills | Status: DC
Start: 2019-12-21 — End: 2020-06-11

## 2019-12-21 MED ORDER — CEFTRIAXONE SODIUM 1 G IJ SOLR
INTRAMUSCULAR | Status: AC
  Filled 2019-12-21: qty 10

## 2019-12-21 NOTE — ED Triage Notes (Signed)
Pt c/o right sided flank pain x 4 days. Pt states she has some pressure with urinating and frequent urination. Pt states yesterday she took 4 ibuprofen for the pain.

## 2019-12-21 NOTE — ED Provider Notes (Signed)
[facility]   MRN: [id number] DOB: January 05, 2001  Subjective:   Patricia [name] is a 19 y.o. female presenting for 4-day history of cute onset polyuria, urinary frequency, pelvic pressure/discomfort when urinating, right flank pain.  Patient states she used some ibuprofen with minimal relief.  Has history of persistent UTIs.  Admits that she has had trouble with medical compliance for the antibiotics for this in the past.  No current facility-administered medications for this encounter.  Current Outpatient Medications:  .  albuterol (VENTOLIN HFA) 108 (90 Base) MCG/ACT inhaler, Inhale 1-2 puffs into the lungs every 6 (six) hours as needed for wheezing or shortness of breath., Disp: 18 g, Rfl: 0 .  benzonatate (TESSALON) 100 MG capsule, Take 1-2 capsules (100-200 mg total) by mouth 3 (three) times daily as needed. (Patient not taking: Reported on 07/25/2019), Disp: 60 capsule, Rfl: 0 .  cetirizine (ZYRTEC ALLERGY) 10 MG tablet, Take 1 tablet (10 mg total) by mouth daily., Disp: 30 tablet, Rfl: 0 .  etonogestrel (NEXPLANON) 68 MG IMPL implant, 1 each by Subdermal route once., Disp: , Rfl:  .  FLUoxetine (PROZAC) 10 MG capsule, Take 10 mg by mouth daily. , Disp: , Rfl:  .  fluticasone (FLONASE) 50 MCG/ACT nasal spray, Place 2 sprays into both nostrils daily. (Patient not taking: Reported on 07/25/2019), Disp: 1 g, Rfl: 0 .  lidocaine (LIDODERM) 5 %, Place 1 patch onto the skin daily. Remove & Discard patch within 12 hours or as directed by MD, Disp: 30 patch, Rfl: 0 .  loratadine (CLARITIN) 10 MG tablet, Take 1 tablet (10 mg total) by mouth daily. (Patient not taking: Reported on 07/25/2019), Disp: 30 tablet, Rfl: 1 .  methocarbamol (ROBAXIN) 500 MG tablet, Take 1 tablet (500 mg total) by mouth 2 (two) times daily., Disp: 20 tablet, Rfl: 0 .  Nebulizer System All-In-One MISC, 1 application by Does not apply route every 4 (four) hours as needed. Use with nebulized albuterol., Disp: 1  each, Rfl: 0 .  promethazine-dextromethorphan (PROMETHAZINE-DM) 6.25-15 MG/5ML syrup, Take 5 mLs by mouth at bedtime as needed for cough. (Patient not taking: Reported on 07/25/2019), Disp: 100 mL, Rfl: 0   No Known Allergies  Past Medical History:  Diagnosis Date  . Asthma      Past Surgical History:  Procedure Laterality Date  . TONSILLECTOMY    . TYMPANOSTOMY TUBE PLACEMENT      Family History  Problem Relation Age of Onset  . Kidney Stones Mother   . Asthma Father     Social History   Tobacco Use  . Smoking status: Never Smoker  . Smokeless tobacco: Never Used  Substance Use Topics  . Alcohol use: No  . Drug use: No    ROS   Objective:   Vitals: BP 123/68 (BP Location: Left Arm)   Pulse 88   Temp 98.1 F (36.7 C) (Oral)   Resp 15   LMP 12/11/2019   SpO2 100%   Physical Exam Constitutional:      General: She is not in acute distress.    Appearance: Normal appearance. She is well-developed. She is not ill-appearing, toxic-appearing or diaphoretic.  HENT:     Head: Normocephalic and atraumatic.     Nose: Nose normal.     Mouth/Throat:     Mouth: Mucous membranes are moist.     Pharynx: Oropharynx is clear.  Eyes:     General: No scleral icterus.       Right  eye: No discharge.        Left eye: No discharge.     Extraocular Movements: Extraocular movements intact.     Conjunctiva/sclera: Conjunctivae normal.     Pupils: Pupils are equal, round, and reactive to light.  Cardiovascular:     Rate and Rhythm: Normal rate.  Pulmonary:     Effort: Pulmonary effort is normal.  Abdominal:     General: Bowel sounds are normal. There is no distension.     Palpations: Abdomen is soft. There is no mass.     Tenderness: There is abdominal tenderness (generalized). There is right CVA tenderness. There is no left CVA tenderness or guarding.  Skin:    General: Skin is warm and dry.  Neurological:     General: No focal deficit present.     Mental Status: She is  alert and oriented to person, place, and time.  Psychiatric:        Mood and Affect: Mood normal.        Behavior: Behavior normal.        Thought Content: Thought content normal.        Judgment: Judgment normal.     Results for orders placed or performed during the hospital encounter of 12/21/19 (from the past 24 hour(s))  POC Urinalysis dipstick     Status: Abnormal   Collection Time: 12/21/19 10:22 AM  Result Value Ref Range   Glucose, UA NEGATIVE NEGATIVE mg/dL   Bilirubin Urine NEGATIVE NEGATIVE   Ketones, ur NEGATIVE NEGATIVE mg/dL   Specific Gravity, Urine 1.020 1.005 - 1.030   Hgb urine dipstick SMALL (A) NEGATIVE   pH 6.5 5.0 - 8.0   Protein, ur 30 (A) NEGATIVE mg/dL   Urobilinogen, UA 2.0 (H) 0.0 - 1.0 mg/dL   Nitrite NEGATIVE NEGATIVE   Leukocytes,Ua SMALL (A) NEGATIVE  POC urine pregnancy     Status: None   Collection Time: 12/21/19 10:29 AM  Result Value Ref Range   Preg Test, Ur NEGATIVE NEGATIVE    Assessment and Plan :   PDMP not reviewed this encounter.  1. Pyelonephritis   2. Right flank pain   3. Urinary frequency   4. Dysuria   5. History of UTI     Will cover for pyelonephritis, urine culture pending. IM ceftriaxone in clinic given history of medical noncompliance.  Keflex twice daily as an outpatient.  Recommended persistent hydration and avoidance of urinary irritants. Counseled patient on potential for adverse effects with medications prescribed/recommended today, ER and return-to-clinic precautions discussed, patient verbalized understanding.    Wallis Bamberg, PA-C 12/21/19 1120

## 2019-12-21 NOTE — Discharge Instructions (Signed)
Make sure you hydrate very well with plain water and a quantity of 64 ounces of water a day.  Please limit drinks that are considered urinary irritants such as soda, sweet tea, coffee, energy drinks, alcohol.  These can worsen your urinary infection symptoms and can also be the source of them.  I will let you know about your urine culture results through MyChart to see if we need to change your antibiotics based off of those results. Do not use any nonsteroidal anti-inflammatories (NSAIDs) like ibuprofen, Motrin, naproxen, Aleve, etc. which are all available over-the-counter.  Please just use Tylenol at a dose of 500mg -650mg  once every 6 hours as needed for your aches, pains, fevers.

## 2019-12-23 LAB — URINE CULTURE: Culture: >=100000 — AB

## 2020-04-03 ENCOUNTER — Other Ambulatory Visit: Payer: 59

## 2020-04-03 DIAGNOSIS — Z20822 Contact with and (suspected) exposure to covid-19: Secondary | ICD-10-CM

## 2020-04-05 LAB — SARS-COV-2, NAA 2 DAY TAT

## 2020-04-05 LAB — NOVEL CORONAVIRUS, NAA: SARS-CoV-2, NAA: NOT DETECTED

## 2020-05-21 ENCOUNTER — Ambulatory Visit (HOSPITAL_COMMUNITY)
Admission: EM | Admit: 2020-05-21 | Discharge: 2020-05-21 | Disposition: A | Discharge status: Home / Self Care | Payer: 59 | Attending: Emergency Medicine | Admitting: Emergency Medicine

## 2020-05-21 ENCOUNTER — Other Ambulatory Visit: Payer: Self-pay

## 2020-05-21 ENCOUNTER — Encounter (HOSPITAL_COMMUNITY): Payer: Self-pay

## 2020-05-21 DIAGNOSIS — M546 Pain in thoracic spine: Secondary | ICD-10-CM

## 2020-05-21 LAB — POCT URINALYSIS DIPSTICK, ED / UC
Bilirubin Urine: NEGATIVE
Glucose, UA: NEGATIVE mg/dL
Ketones, ur: NEGATIVE mg/dL
Leukocytes,Ua: NEGATIVE
Nitrite: NEGATIVE
Protein, ur: NEGATIVE mg/dL
Specific Gravity, Urine: 1.02 (ref 1.005–1.030)
Urobilinogen, UA: 0.2 mg/dL (ref 0.0–1.0)
pH: 6 (ref 5.0–8.0)

## 2020-05-21 MED ORDER — IBUPROFEN 600 MG PO TABS
600.0000 mg | ORAL_TABLET | Freq: Four times a day (QID) | ORAL | 0 refills | Status: DC | PRN
Start: 2020-05-21 — End: 2020-10-24

## 2020-05-21 NOTE — Discharge Instructions (Addendum)
Take ibuprofen as needed for discomfort.    Follow up with your primary care provider or an orthopedist if your symptoms are not improving.      

## 2020-05-21 NOTE — ED Triage Notes (Signed)
Pt presents with generalized back pain for past few weeks with no other complaints.

## 2020-05-21 NOTE — ED Provider Notes (Signed)
[facility]    CSN: [id number] Arrival date & time: 05/21/20  1219      History   Chief Complaint Chief Complaint  Patient presents with  . Back Pain    HPI Patricia [name] is a 20 y.o. female.   Patient presents with bilateral mid back pain x2 to 3 weeks.  No falls or known injury.  She reports intermittent tingling in her hands for several years.  She denies numbness, weakness, abdominal pain, dysuria, fever, chills, or other symptoms.  Treatment attempted at home with Tylenol which provides good temporary relief.  Her medical history includes asthma.  The history is provided by the patient and medical records.    Past Medical History:  Diagnosis Date  . Asthma     There are no problems to display for this patient.   Past Surgical History:  Procedure Laterality Date  . TONSILLECTOMY    . TYMPANOSTOMY TUBE PLACEMENT      OB History   No obstetric history on file.      Home Medications    Prior to Admission medications   Medication Sig Start Date End Date Taking? Authorizing Provider  ibuprofen (ADVIL) 600 MG tablet Take 1 tablet (600 mg total) by mouth every 6 (six) hours as needed. 05/21/20  Yes Mickie Bail, NP  albuterol (VENTOLIN HFA) 108 (90 Base) MCG/ACT inhaler Inhale 1-2 puffs into the lungs every 6 (six) hours as needed for wheezing or shortness of breath. 09/18/19   Myra Rude, MD  benzonatate (TESSALON) 100 MG capsule Take 1-2 capsules (100-200 mg total) by mouth 3 (three) times daily as needed. Patient not taking: Reported on 07/25/2019 06/05/19   Wallis Bamberg, PA-C  cephALEXin (KEFLEX) 500 MG capsule Take 1 capsule (500 mg total) by mouth 2 (two) times daily. 12/21/19   Wallis Bamberg, PA-C  cetirizine (ZYRTEC ALLERGY) 10 MG tablet Take 1 tablet (10 mg total) by mouth daily. 07/21/19   Mardella Layman, MD  etonogestrel (NEXPLANON) 68 MG IMPL implant 1 each by Subdermal route once.    [provider]  FLUoxetine (PROZAC) 10 MG capsule  Take 10 mg by mouth daily.     [provider]  fluticasone (FLONASE) 50 MCG/ACT nasal spray Place 2 sprays into both nostrils daily. Patient not taking: Reported on 07/25/2019 06/02/17   Belinda Fisher, PA-C  lidocaine (LIDODERM) 5 % Place 1 patch onto the skin daily. Remove & Discard patch within 12 hours or as directed by MD 07/25/19   Joy, Shawn C, PA-C  loratadine (CLARITIN) 10 MG tablet Take 1 tablet (10 mg total) by mouth daily. Patient not taking: Reported on 07/25/2019 12/07/11 12/19/27  Johnsie Kindred, NP  methocarbamol (ROBAXIN) 500 MG tablet Take 1 tablet (500 mg total) by mouth 2 (two) times daily. 07/25/19   Joy, Hillard Danker, PA-C  Nebulizer System All-In-One MISC 1 application by Does not apply route every 4 (four) hours as needed. Use with nebulized albuterol. 06/05/19   Wallis Bamberg, PA-C  promethazine-dextromethorphan (PROMETHAZINE-DM) 6.25-15 MG/5ML syrup Take 5 mLs by mouth at bedtime as needed for cough. Patient not taking: Reported on 07/25/2019 06/05/19   Wallis Bamberg, PA-C    Family History Family History  Problem Relation Age of Onset  . Kidney Stones Mother   . Asthma Father     Social History Social History   Tobacco Use  . Smoking status: Never Smoker  . Smokeless tobacco: Never Used  Substance Use Topics  .  Alcohol use: No  . Drug use: No     Allergies   Patient has no known allergies.   Review of Systems Review of Systems  Constitutional: Negative for chills and fever.  HENT: Negative for ear pain and sore throat.   Eyes: Negative for pain and visual disturbance.  Respiratory: Negative for cough and shortness of breath.   Cardiovascular: Negative for chest pain and palpitations.  Gastrointestinal: Negative for abdominal pain and vomiting.  Genitourinary: Negative for dysuria and hematuria.  Musculoskeletal: Positive for back pain. Negative for gait problem.  Skin: Negative for color change and rash.  Neurological: Negative for syncope, weakness and  numbness.  All other systems reviewed and are negative.    Physical Exam Triage Vital Signs ED Triage Vitals  Enc Vitals Group     BP      Pulse      Resp      Temp      Temp src      SpO2      Weight      Height      Head Circumference      Peak Flow      Pain Score      Pain Loc      Pain Edu?      Excl. in GC?    No data found.  Updated Vital Signs BP 122/71 (BP Location: Right Arm)   Pulse 85   Temp 98.2 F (36.8 C) (Oral)   Resp 17   LMP 05/20/2020   SpO2 98%   Visual Acuity Right Eye Distance:   Left Eye Distance:   Bilateral Distance:    Right Eye Near:   Left Eye Near:    Bilateral Near:     Physical Exam Vitals and nursing note reviewed.  Constitutional:      General: She is not in acute distress.    Appearance: She is well-developed and well-nourished. She is not ill-appearing.  HENT:     Head: Normocephalic and atraumatic.     Mouth/Throat:     Mouth: Mucous membranes are moist.  Eyes:     Conjunctiva/sclera: Conjunctivae normal.  Cardiovascular:     Rate and Rhythm: Normal rate and regular rhythm.     Heart sounds: Normal heart sounds.  Pulmonary:     Effort: Pulmonary effort is normal. No respiratory distress.     Breath sounds: Normal breath sounds.  Abdominal:     General: Bowel sounds are normal.     Palpations: Abdomen is soft.     Tenderness: There is no abdominal tenderness. There is no right CVA tenderness, left CVA tenderness, guarding or rebound.  Musculoskeletal:        General: No swelling, tenderness, deformity or edema. Normal range of motion.     Cervical back: Neck supple.     Comments: Back nontender to palpation.   Skin:    General: Skin is warm and dry.     Findings: No bruising, erythema, lesion or rash.  Neurological:     General: No focal deficit present.     Mental Status: She is alert and oriented to person, place, and time.     Sensory: No sensory deficit.     Motor: No weakness.     Gait: Gait normal.   Psychiatric:        Mood and Affect: Mood and affect and mood normal.        Behavior: Behavior normal.  UC Treatments / Results  Labs (all labs ordered are listed, but only abnormal results are displayed) Labs Reviewed  POCT URINALYSIS DIPSTICK, ED / UC - Abnormal; Notable for the following components:      Result Value   Hgb urine dipstick LARGE (*)    All other components within normal limits    EKG   Radiology No results found.  Procedures Procedures (including critical care time)  Medications Ordered in UC Medications - No data to display  Initial Impression / Assessment and Plan / UC Course  I have reviewed the triage vital signs and the nursing notes.  Pertinent labs & imaging results that were available during my care of the patient were reviewed by me and considered in my medical decision making (see chart for details).   Acute mid back pain.  Urine does not indicate infection. Treating with ibuprofen, rest, warm compresses. Instructed patient to follow-up with her PCP or an orthopedist if her symptoms are not improving. She agrees to plan of care.   Final Clinical Impressions(s) / UC Diagnoses   Final diagnoses:  Acute bilateral thoracic back pain     Discharge Instructions     Take ibuprofen as needed for discomfort.     Follow up with your primary care provider or an orthopedist if your symptoms are not improving.          ED Prescriptions    Medication Sig Dispense Auth. Provider   ibuprofen (ADVIL) 600 MG tablet Take 1 tablet (600 mg total) by mouth every 6 (six) hours as needed. 30 tablet Mickie Bail, NP     I have reviewed the PDMP during this encounter.   Mickie Bail, NP 05/21/20 [phone]

## 2020-06-10 ENCOUNTER — Ambulatory Visit (HOSPITAL_COMMUNITY): Payer: 59

## 2020-06-11 ENCOUNTER — Ambulatory Visit
Admission: EM | Admit: 2020-06-11 | Discharge: 2020-06-11 | Disposition: A | Discharge status: Home / Self Care | Payer: 59 | Attending: Emergency Medicine | Admitting: Emergency Medicine

## 2020-06-11 ENCOUNTER — Other Ambulatory Visit: Payer: Self-pay

## 2020-06-11 ENCOUNTER — Encounter: Payer: Self-pay | Admitting: Emergency Medicine

## 2020-06-11 DIAGNOSIS — J069 Acute upper respiratory infection, unspecified: Secondary | ICD-10-CM | POA: Diagnosis present

## 2020-06-11 DIAGNOSIS — J4520 Mild intermittent asthma, uncomplicated: Secondary | ICD-10-CM | POA: Diagnosis present

## 2020-06-11 LAB — POCT RAPID STREP A (OFFICE): Rapid Strep A Screen: NEGATIVE

## 2020-06-11 MED ORDER — CETIRIZINE HCL 10 MG PO CAPS
10.0000 mg | ORAL_CAPSULE | Freq: Every day | ORAL | 0 refills | Status: DC
Start: 2020-06-11 — End: 2020-08-22

## 2020-06-11 MED ORDER — FLUTICASONE PROPIONATE 50 MCG/ACT NA SUSP: 1.0000 | Freq: Every day | NASAL | 0 refills | Status: DC

## 2020-06-11 MED ORDER — BENZONATATE 200 MG PO CAPS: 200.0000 mg | ORAL_CAPSULE | Freq: Three times a day (TID) | ORAL | 0 refills | Status: AC | PRN

## 2020-06-11 MED ORDER — PREDNISONE 50 MG PO TABS
50.0000 mg | ORAL_TABLET | Freq: Every day | ORAL | 0 refills | Status: AC
Start: 2020-06-11 — End: 2020-06-16

## 2020-06-11 NOTE — ED Triage Notes (Signed)
Patient c/o bilateral ear pain and sore throat x 2 days.   Patient denies fever but states "I had some feeling of my body getting warm or overheating".   Patient endorses painful swallowing and increased pain when coughing.   Patient denies any changes in hearing.   Patient endorses a productive cough w/ "yellow" sputum production.   Patient has taken benadryl w/ no relief of symptoms.

## 2020-06-11 NOTE — Discharge Instructions (Signed)
Strep test negative Begin daily cetirizine and Flonase to help with sinus congestion, postnasal drainage Tessalon every 8 hours for cough Prednisone daily for the next 5 days to help with sinus inflammation and fluid on ears/asthma Continue inhaler as needed  Please follow-up if not improving or worsening

## 2020-06-11 NOTE — ED Provider Notes (Signed)
[facility]    CSN: [id number] Arrival date & time: 06/11/20  1405      History   Chief Complaint Chief Complaint  Patient presents with  . Otalgia  . Sore Throat    HPI Patricia [name] is a 20 y.o. female history of asthma presenting today for sore throat and ear pain.  Reports symptoms for the past 2 days.  Said associated ear discomfort especially on right side.  Associated hoarseness.  Reports cough at baseline for asthma, but has felt some wheezing.  Cough with some occasional yellow mucus.  Has subjective fevers, but no known measured fevers.  Using Benadryl without relief of symptoms.  Reports taking at home Covid test which was negative.  HPI  Past Medical History:  Diagnosis Date  . Asthma     There are no problems to display for this patient.   Past Surgical History:  Procedure Laterality Date  . TONSILLECTOMY    . TYMPANOSTOMY TUBE PLACEMENT      OB History   No obstetric history on file.      Home Medications    Prior to Admission medications   Medication Sig Start Date End Date Taking? Authorizing Provider  benzonatate (TESSALON) 200 MG capsule Take 1 capsule (200 mg total) by mouth 3 (three) times daily as needed for up to 7 days for cough. 06/11/20 06/18/20 Yes Kolin Erdahl C, PA-C  Cetirizine HCl 10 MG CAPS Take 1 capsule (10 mg total) by mouth daily for 10 days. 06/11/20 06/21/20 Yes Takayla Baillie C, PA-C  fluticasone (FLONASE) 50 MCG/ACT nasal spray Place 1-2 sprays into both nostrils daily. 06/11/20  Yes Amaiya Scruton C, PA-C  predniSONE (DELTASONE) 50 MG tablet Take 1 tablet (50 mg total) by mouth daily for 5 days. 06/11/20 06/16/20 Yes Kyl Givler C, PA-C  albuterol (VENTOLIN HFA) 108 (90 Base) MCG/ACT inhaler Inhale 1-2 puffs into the lungs every 6 (six) hours as needed for wheezing or shortness of breath. 09/18/19   Myra Rude, MD  etonogestrel (NEXPLANON) 68 MG IMPL implant 1 each by Subdermal route once.    [provider]  FLUoxetine (PROZAC) 10 MG capsule Take 10 mg by mouth daily.     [provider]  ibuprofen (ADVIL) 600 MG tablet Take 1 tablet (600 mg total) by mouth every 6 (six) hours as needed. 05/21/20   Mickie Bail, NP  lidocaine (LIDODERM) 5 % Place 1 patch onto the skin daily. Remove & Discard patch within 12 hours or as directed by MD 07/25/19   Joy, Shawn C, PA-C  methocarbamol (ROBAXIN) 500 MG tablet Take 1 tablet (500 mg total) by mouth 2 (two) times daily. 07/25/19   Joy, Hillard Danker, PA-C  Nebulizer System All-In-One MISC 1 application by Does not apply route every 4 (four) hours as needed. Use with nebulized albuterol. 06/05/19   Wallis Bamberg, PA-C  loratadine (CLARITIN) 10 MG tablet Take 1 tablet (10 mg total) by mouth daily. Patient not taking: No sig reported 12/07/11 06/11/20  Johnsie Kindred, NP    Family History Family History  Problem Relation Age of Onset  . Kidney Stones Mother   . Asthma Father     Social History Social History   Tobacco Use  . Smoking status: Never Smoker  . Smokeless tobacco: Never Used  Substance Use Topics  . Alcohol use: No  . Drug use: No     Allergies   Patient has no known allergies.  Review of Systems Review of Systems  Constitutional: Negative for activity change, appetite change, chills, fatigue and fever.  HENT: Positive for congestion, ear pain, rhinorrhea and sore throat. Negative for sinus pressure and trouble swallowing.   Eyes: Negative for discharge and redness.  Respiratory: Positive for cough. Negative for chest tightness and shortness of breath.   Cardiovascular: Negative for chest pain.  Gastrointestinal: Negative for abdominal pain, diarrhea, nausea and vomiting.  Musculoskeletal: Negative for myalgias.  Skin: Negative for rash.  Neurological: Negative for dizziness, light-headedness and headaches.     Physical Exam Triage Vital Signs ED Triage Vitals  Enc Vitals Group     BP 06/11/20 1436 126/82      Pulse Rate 06/11/20 1436 93     Resp 06/11/20 1436 13     Temp 06/11/20 1436 98 F (36.7 C)     Temp Source 06/11/20 1436 Oral     SpO2 06/11/20 1436 98 %     Weight --      Height --      Head Circumference --      Peak Flow --      Pain Score 06/11/20 1434 7     Pain Loc --      Pain Edu? --      Excl. in GC? --    No data found.  Updated Vital Signs BP 126/82 (BP Location: Right Arm)   Pulse 93   Temp 98 F (36.7 C) (Oral)   Resp 13   LMP 06/09/2020   SpO2 98%   Visual Acuity Right Eye Distance:   Left Eye Distance:   Bilateral Distance:    Right Eye Near:   Left Eye Near:    Bilateral Near:     Physical Exam Vitals and nursing note reviewed.  Constitutional:      Appearance: She is well-developed.     Comments: No acute distress  HENT:     Head: Normocephalic and atraumatic.     Ears:     Comments: Bilateral ears without tenderness to palpation of external auricle, tragus and mastoid, EAC's without erythema or swelling, TM's with good bony landmarks and cone of light. Non erythematous.     Nose: Nose normal.     Mouth/Throat:     Comments: Oral mucosa pink and moist, no tonsillar enlargement or exudate. Posterior pharynx patent and nonerythematous, no uvula deviation or swelling. Normal phonation. Eyes:     Conjunctiva/sclera: Conjunctivae normal.  Cardiovascular:     Rate and Rhythm: Normal rate.  Pulmonary:     Effort: Pulmonary effort is normal. No respiratory distress.     Comments: Breathing comfortably at rest, CTABL, no wheezing, rales or other adventitious sounds auscultated Abdominal:     General: There is no distension.  Musculoskeletal:        General: Normal range of motion.     Cervical back: Neck supple.  Skin:    General: Skin is warm and dry.  Neurological:     Mental Status: She is alert and oriented to person, place, and time.      UC Treatments / Results  Labs (all labs ordered are listed, but only abnormal results are  displayed) Labs Reviewed  CULTURE, GROUP A STREP [facility])  POCT RAPID STREP A (OFFICE)    EKG   Radiology No results found.  Procedures Procedures (including critical care time)  Medications Ordered in UC Medications - No data to display  Initial Impression / Assessment and Plan /  UC Course  I have reviewed the triage vital signs and the nursing notes.  Pertinent labs & imaging results that were available during my care of the patient were reviewed by me and considered in my medical decision making (see chart for details).     Viral URI with cough in setting of asthma-no sign of otitis media at this time, opting to place on prednisone to help with underlying sinus inflammation/asthma, continue Zyrtec and Flonase for further relief of ear discomfort/congestion and postnasal drainage likely causing throat irritation and hoarseness.  Push fluids.  Discussed strict return precautions. Patient verbalized understanding and is agreeable with plan.  Final Clinical Impressions(s) / UC Diagnoses   Final diagnoses:  Viral URI with cough  Mild intermittent asthma without complication     Discharge Instructions     Strep test negative Begin daily cetirizine and Flonase to help with sinus congestion, postnasal drainage Tessalon every 8 hours for cough Prednisone daily for the next 5 days to help with sinus inflammation and fluid on ears/asthma Continue inhaler as needed  Please follow-up if not improving or worsening    ED Prescriptions    Medication Sig Dispense Auth. Provider   predniSONE (DELTASONE) 50 MG tablet Take 1 tablet (50 mg total) by mouth daily for 5 days. 5 tablet Roxas Clymer C, PA-C   fluticasone (FLONASE) 50 MCG/ACT nasal spray Place 1-2 sprays into both nostrils daily. 16 g Estefani Bateson C, PA-C   Cetirizine HCl 10 MG CAPS Take 1 capsule (10 mg total) by mouth daily for 10 days. 10 capsule Caesar Mannella C, PA-C   benzonatate (TESSALON) 200 MG capsule  Take 1 capsule (200 mg total) by mouth 3 (three) times daily as needed for up to 7 days for cough. 28 capsule Arel Tippen, [place] C, PA-C     PDMP not reviewed this encounter.   Lew Dawes, PA-C 06/11/20 1513

## 2020-06-14 LAB — CULTURE, GROUP A STREP (THRC)

## 2020-08-22 ENCOUNTER — Ambulatory Visit: Payer: Self-pay

## 2020-08-22 ENCOUNTER — Other Ambulatory Visit: Payer: Self-pay

## 2020-08-22 ENCOUNTER — Ambulatory Visit (HOSPITAL_COMMUNITY)
Admission: EM | Admit: 2020-08-22 | Discharge: 2020-08-22 | Disposition: A | Discharge status: Home / Self Care | Payer: 59 | Attending: Emergency Medicine | Admitting: Emergency Medicine

## 2020-08-22 ENCOUNTER — Encounter (HOSPITAL_COMMUNITY): Payer: Self-pay | Admitting: Emergency Medicine

## 2020-08-22 DIAGNOSIS — Z20822 Contact with and (suspected) exposure to covid-19: Secondary | ICD-10-CM | POA: Insufficient documentation

## 2020-08-22 DIAGNOSIS — Z79899 Other long term (current) drug therapy: Secondary | ICD-10-CM | POA: Insufficient documentation

## 2020-08-22 DIAGNOSIS — J4521 Mild intermittent asthma with (acute) exacerbation: Secondary | ICD-10-CM | POA: Diagnosis not present

## 2020-08-22 MED ORDER — ALBUTEROL SULFATE (2.5 MG/3ML) 0.083% IN NEBU
2.5000 mg | INHALATION_SOLUTION | Freq: Four times a day (QID) | RESPIRATORY_TRACT | 12 refills | Status: DC | PRN
Start: 2020-08-22 — End: 2021-12-11

## 2020-08-22 MED ORDER — CETIRIZINE HCL 10 MG PO CAPS: 10.0000 mg | ORAL_CAPSULE | Freq: Every day | ORAL | 0 refills | Status: DC

## 2020-08-22 MED ORDER — PREDNISONE 50 MG PO TABS
50.0000 mg | ORAL_TABLET | Freq: Every day | ORAL | 0 refills | Status: DC
Start: 2020-08-22 — End: 2020-10-24

## 2020-08-22 MED ORDER — NEBULIZER SYSTEM ALL-IN-ONE MISC: 1.0000 "application " | 0 refills | Status: DC | PRN

## 2020-08-22 MED ORDER — ALBUTEROL SULFATE HFA 108 (90 BASE) MCG/ACT IN AERS: 1.0000 | INHALATION_SPRAY | Freq: Four times a day (QID) | RESPIRATORY_TRACT | 0 refills | Status: DC | PRN

## 2020-08-22 NOTE — Discharge Instructions (Addendum)
Can use inhaler or nebulizer treatment every 4 hours as needed to help with shortness of breath and wheezing  Covid testing pending 24 hours, you will be called if positive   Continue to use cetrizine daily for allergies  Take prednisone once every morning for 5 days  If symptoms worsen or do not improve after completion of medication please follow up for reevaluation

## 2020-08-22 NOTE — ED Provider Notes (Signed)
[place]    CSN: [id number] Arrival date & time: 08/22/20  1307      History   Chief Complaint Chief Complaint  Patient presents with  . Cough    HPI Patricia [name] is a 20 y.o. female.   Patient presents with productive cough, intermittent wheezing and shortness of breath at rest, congestion, generalized headache and sore throat for 4 days. Has been using inhaler frequently with relief only lasting 30-45 minutes. History of allergies, not taking medication consistently. Denies fever, chills, body aches, ear pain, chest pain, nausea, vomiting.   Past Medical History:  Diagnosis Date  . Asthma     There are no problems to display for this patient.   Past Surgical History:  Procedure Laterality Date  . TONSILLECTOMY    . TYMPANOSTOMY TUBE PLACEMENT      OB History   No obstetric history on file.      Home Medications    Prior to Admission medications   Medication Sig Start Date End Date Taking? Authorizing Provider  albuterol (PROVENTIL) (2.5 MG/3ML) 0.083% nebulizer solution Take 3 mLs (2.5 mg total) by nebulization every 6 (six) hours as needed for wheezing or shortness of breath. 08/22/20  Yes Nadia Viar R, NP  predniSONE (DELTASONE) 50 MG tablet Take 1 tablet (50 mg total) by mouth daily. 08/22/20  Yes Arieon Scalzo, Leitha Schuller, NP  albuterol (VENTOLIN HFA) 108 (90 Base) MCG/ACT inhaler Inhale 1-2 puffs into the lungs every 6 (six) hours as needed for wheezing or shortness of breath. 08/22/20   Hans Eden, NP  Cetirizine HCl 10 MG CAPS Take 1 capsule (10 mg total) by mouth daily for 10 days. 08/22/20 09/01/20  Hans Eden, NP  etonogestrel (NEXPLANON) 68 MG IMPL implant 1 each by Subdermal route once.    [provider]  FLUoxetine (PROZAC) 10 MG capsule Take 10 mg by mouth daily.     [provider]  fluticasone (FLONASE) 50 MCG/ACT nasal spray Place 1-2 sprays into both nostrils daily. 06/11/20   Wieters, Hallie C, PA-C   ibuprofen (ADVIL) 600 MG tablet Take 1 tablet (600 mg total) by mouth every 6 (six) hours as needed. 05/21/20   Sharion Balloon, NP  lidocaine (LIDODERM) 5 % Place 1 patch onto the skin daily. Remove & Discard patch within 12 hours or as directed by MD 07/25/19   Joy, Shawn C, PA-C  methocarbamol (ROBAXIN) 500 MG tablet Take 1 tablet (500 mg total) by mouth 2 (two) times daily. 07/25/19   Joy, Helane Gunther, PA-C  Nebulizer System All-In-One MISC 1 application by Does not apply route every 4 (four) hours as needed. Use with nebulized albuterol. 08/22/20   Hans Eden, NP  loratadine (CLARITIN) 10 MG tablet Take 1 tablet (10 mg total) by mouth daily. Patient not taking: No sig reported 12/07/11 06/11/20  Awilda Metro, NP    Family History Family History  Problem Relation Age of Onset  . Kidney Stones Mother   . Asthma Father     Social History Social History   Tobacco Use  . Smoking status: Never Smoker  . Smokeless tobacco: Never Used  Substance Use Topics  . Alcohol use: No  . Drug use: No     Allergies   Patient has no known allergies.   Review of Systems Review of Systems  Constitutional: Negative.   HENT: Positive for congestion and sore throat. Negative for dental problem, drooling, ear discharge, ear pain, facial swelling,  hearing loss, mouth sores, nosebleeds, postnasal drip, rhinorrhea, sinus pressure, sinus pain, sneezing, tinnitus, trouble swallowing and voice change.   Respiratory: Positive for cough, shortness of breath and wheezing. Negative for apnea, choking, chest tightness and stridor.   Cardiovascular: Negative.   Gastrointestinal: Negative.   Skin: Negative.   Neurological: Negative.      Physical Exam Triage Vital Signs ED Triage Vitals  Enc Vitals Group     BP 08/22/20 1334 116/89     Pulse Rate 08/22/20 1334 83     Resp 08/22/20 1334 18     Temp 08/22/20 1334 98.9 F (37.2 C)     Temp Source 08/22/20 1334 Oral     SpO2 08/22/20 1334 100 %      Weight --      Height --      Head Circumference --      Peak Flow --      Pain Score 08/22/20 1335 4     Pain Loc --      Pain Edu? --      Excl. in [place]? --    No data found.  Updated Vital Signs BP 116/89 (BP Location: Right Arm)   Pulse 83   Temp 98.9 F (37.2 C) (Oral)   Resp 18   LMP 08/09/2020   SpO2 100%   Visual Acuity Right Eye Distance:   Left Eye Distance:   Bilateral Distance:    Right Eye Near:   Left Eye Near:    Bilateral Near:     Physical Exam Constitutional:      Appearance: Normal appearance. She is normal weight.  HENT:     Head: Normocephalic.     Right Ear: Tympanic membrane, ear canal and external ear normal.     Left Ear: Tympanic membrane, ear canal and external ear normal.     Nose: Congestion present. No rhinorrhea.     Mouth/Throat:     Mouth: Mucous membranes are moist.     Pharynx: Oropharynx is clear.  Eyes:     Extraocular Movements: Extraocular movements intact.     Conjunctiva/sclera: Conjunctivae normal.     Pupils: Pupils are equal, round, and reactive to light.  Cardiovascular:     Rate and Rhythm: Normal rate and regular rhythm.     Pulses: Normal pulses.     Heart sounds: Normal heart sounds.  Pulmonary:     Effort: Pulmonary effort is normal.     Breath sounds: Normal breath sounds.  Musculoskeletal:        General: Normal range of motion.     Cervical back: Normal range of motion.  Skin:    General: Skin is warm and dry.  Neurological:     General: No focal deficit present.     Mental Status: She is alert and oriented to person, place, and time. Mental status is at baseline.  Psychiatric:        Mood and Affect: Mood normal.        Behavior: Behavior normal.        Thought Content: Thought content normal.        Judgment: Judgment normal.      UC Treatments / Results  Labs (all labs ordered are listed, but only abnormal results are displayed) Labs Reviewed  SARS CORONAVIRUS 2 (TAT 6-24 HRS)     EKG   Radiology No results found.  Procedures Procedures (including critical care time)  Medications Ordered in UC Medications - No data to  display  Initial Impression / Assessment and Plan / UC Course  I have reviewed the triage vital signs and the nursing notes.  Pertinent labs & imaging results that were available during my care of the patient were reviewed by me and considered in my medical decision making (see chart for details).  Asthma exacerbation   1. Prednisone 50 mg daily for 5 days 2. Albuterol inhaler refilled 3. albuterol 2.5 mL nebulizer every 4 hours prn with kit, per patient feels she inhales medication better with nebulizer 4. Cetrizine 10 mg daily refilled  5. instructed to follow up if no improvement in 1 week.  6. covid test pendingg  Final Clinical Impressions(s) / UC Diagnoses   Final diagnoses:  Mild intermittent asthma with exacerbation     Discharge Instructions     Can use inhaler or nebulizer treatment every 4 hours as needed to help with shortness of breath and wheezing  Covid testing pending 24 hours, you will be called if positive   Continue to use cetrizine daily for allergies  Take prednisone once every morning for 5 days  If symptoms worsen or do not improve after completion of medication please follow up for reevaluation     ED Prescriptions    Medication Sig Dispense Auth. Provider   Cetirizine HCl 10 MG CAPS Take 1 capsule (10 mg total) by mouth daily for 10 days. 10 capsule Saharah Sherrow R, NP   albuterol (VENTOLIN HFA) 108 (90 Base) MCG/ACT inhaler Inhale 1-2 puffs into the lungs every 6 (six) hours as needed for wheezing or shortness of breath. 18 g Lowella Petties R, NP   Nebulizer System All-In-One MISC 1 application by Does not apply route every 4 (four) hours as needed. Use with nebulized albuterol. 1 each Hans Eden, NP   albuterol (PROVENTIL) (2.5 MG/3ML) 0.083% nebulizer solution Take 3 mLs (2.5 mg total)  by nebulization every 6 (six) hours as needed for wheezing or shortness of breath. 75 mL Layonna Dobie R, NP   predniSONE (DELTASONE) 50 MG tablet Take 1 tablet (50 mg total) by mouth daily. 5 tablet Hans Eden, NP     PDMP not reviewed this encounter.   Hans Eden, NP 08/22/20 1420

## 2020-08-22 NOTE — ED Triage Notes (Addendum)
Patient presents to [facility] for evaluation of cough x 4 days.  States she doesn't ever know when she is sick or having asthma issues or allergy issues.  Patient c/o headache.  Patient states she has been using her rescue inhaler at home, but it doesn't last like it usually does and she gets wheezy

## 2020-08-23 LAB — SARS CORONAVIRUS 2 (TAT 6-24 HRS): SARS Coronavirus 2: NEGATIVE

## 2020-10-24 ENCOUNTER — Other Ambulatory Visit: Payer: Self-pay

## 2020-10-24 ENCOUNTER — Encounter (HOSPITAL_COMMUNITY): Payer: Self-pay | Admitting: *Deleted

## 2020-10-24 ENCOUNTER — Ambulatory Visit (HOSPITAL_COMMUNITY)
Admission: EM | Admit: 2020-10-24 | Discharge: 2020-10-24 | Disposition: A | Discharge status: Home / Self Care | Payer: 59 | Attending: Internal Medicine | Admitting: Internal Medicine

## 2020-10-24 ENCOUNTER — Ambulatory Visit (INDEPENDENT_AMBULATORY_CARE_PROVIDER_SITE_OTHER): Payer: 59

## 2020-10-24 DIAGNOSIS — R509 Fever, unspecified: Secondary | ICD-10-CM

## 2020-10-24 DIAGNOSIS — R059 Cough, unspecified: Secondary | ICD-10-CM | POA: Diagnosis not present

## 2020-10-24 DIAGNOSIS — J209 Acute bronchitis, unspecified: Secondary | ICD-10-CM | POA: Diagnosis not present

## 2020-10-24 DIAGNOSIS — J4521 Mild intermittent asthma with (acute) exacerbation: Secondary | ICD-10-CM | POA: Diagnosis not present

## 2020-10-24 MED ORDER — GUAIFENESIN ER 600 MG PO TB12
600.0000 mg | ORAL_TABLET | Freq: Two times a day (BID) | ORAL | 0 refills | Status: AC
Start: 2020-10-24 — End: 2020-11-08

## 2020-10-24 MED ORDER — DOXYCYCLINE HYCLATE 100 MG PO CAPS
100.0000 mg | ORAL_CAPSULE | Freq: Two times a day (BID) | ORAL | 0 refills | Status: AC
Start: 2020-10-24 — End: 2020-10-31

## 2020-10-24 MED ORDER — PREDNISONE 20 MG PO TABS: 20.0000 mg | ORAL_TABLET | Freq: Every day | ORAL | 0 refills | Status: AC

## 2020-10-24 MED ORDER — MONTELUKAST SODIUM 10 MG PO TABS
10.0000 mg | ORAL_TABLET | Freq: Every day | ORAL | 1 refills | Status: DC
Start: 2020-10-24 — End: 2020-12-05

## 2020-10-24 NOTE — Discharge Instructions (Addendum)
Please take medications as recommended Chest x-ray does not show pneumonia If your symptoms worsen please return to urgent care to be reevaluated We will call you with recommendations if labs are abnormal.

## 2020-10-24 NOTE — ED Triage Notes (Signed)
C/O occasionally productive cough onset approx 2 wks ago; states "I always have an underlying cough with my asthma", but cough became productive 2 wks ago.  C/O fever up to 102 last night.  Has been using albuterol neb - last dose last night.  Also took Advil PM.  Has not taken any meds today. C/O body aches.  Denies nasal congestion or runny nose.

## 2020-10-24 NOTE — ED Notes (Signed)
Pt declining Covid test at [facility]; states she does her Covid tests at place of employment.

## 2020-10-25 NOTE — ED Provider Notes (Signed)
[facility]    CSN: [id number] Arrival date & time: 10/24/20  3338      History   Chief Complaint Chief Complaint  Patient presents with   Cough   Fever    HPI Patricia [name] is a 20 y.o. female with a history of asthma comes to urgent care with complaints of productive cough of 2 weeks duration.  Patient describes the cough as being productive of yellowish sputum.  She has been using her inhaler more frequently with no improvement in her symptoms.  She has some shortness of breath and occasional wheezing.  No nausea, vomiting or diarrhea.  Yesterday the patient had a temperature of 102 Fahrenheit.  No sick exposures.  Home COVID-19 test is negative.  Patient is not vaccinated against COVID-19 virus.   HPI  Past Medical History:  Diagnosis Date   Asthma     There are no problems to display for this patient.   Past Surgical History:  Procedure Laterality Date   TONSILLECTOMY     TYMPANOSTOMY TUBE PLACEMENT      OB History   No obstetric history on file.      Home Medications    Prior to Admission medications   Medication Sig Start Date End Date Taking? Authorizing Provider  albuterol (PROVENTIL) (2.5 MG/3ML) 0.083% nebulizer solution Take 3 mLs (2.5 mg total) by nebulization every 6 (six) hours as needed for wheezing or shortness of breath. 08/22/20  Yes White, Adrienne R, NP  albuterol (VENTOLIN HFA) 108 (90 Base) MCG/ACT inhaler Inhale 1-2 puffs into the lungs every 6 (six) hours as needed for wheezing or shortness of breath. 08/22/20  Yes White, Adrienne R, NP  Cetirizine HCl 10 MG CAPS Take 1 capsule (10 mg total) by mouth daily for 10 days. 08/22/20 10/24/20 Yes White, Elita Boone, NP  doxycycline (VIBRAMYCIN) 100 MG capsule Take 1 capsule (100 mg total) by mouth 2 (two) times daily for 7 days. 10/24/20 10/31/20 Yes Clemens Lachman, Britta Mccreedy, MD  etonogestrel (NEXPLANON) 68 MG IMPL implant 1 each by Subdermal route once.   Yes [provider]  fluticasone  (FLONASE) 50 MCG/ACT nasal spray Place 1-2 sprays into both nostrils daily. 06/11/20  Yes Wieters, Hallie C, PA-C  guaiFENesin (MUCINEX) 600 MG 12 hr tablet Take 1 tablet (600 mg total) by mouth 2 (two) times daily for 15 days. 10/24/20 11/08/20 Yes Myers Tutterow, Britta Mccreedy, MD  montelukast (SINGULAIR) 10 MG tablet Take 1 tablet (10 mg total) by mouth at bedtime. 10/24/20  Yes Anil Havard, Britta Mccreedy, MD  predniSONE (DELTASONE) 20 MG tablet Take 1 tablet (20 mg total) by mouth daily for 5 days. 10/24/20 10/29/20 Yes Maze Corniel, Britta Mccreedy, MD  Nebulizer System All-In-One MISC 1 application by Does not apply route every 4 (four) hours as needed. Use with nebulized albuterol. 08/22/20   Valinda Hoar, NP  loratadine (CLARITIN) 10 MG tablet Take 1 tablet (10 mg total) by mouth daily. Patient not taking: No sig reported 12/07/11 06/11/20  Johnsie Kindred, NP    Family History Family History  Problem Relation Age of Onset   Kidney Stones Mother    Asthma Father     Social History Social History   Tobacco Use   Smoking status: Every Day    Types: Cigars   Smokeless tobacco: Never  Vaping Use   Vaping Use: Never used  Substance Use Topics   Alcohol use: Yes    Comment: occasionally   Drug use: Yes  Types: Marijuana     Allergies   Patient has no known allergies.   Review of Systems Review of Systems  HENT: Negative.  Negative for congestion.   Respiratory:  Positive for cough, chest tightness and shortness of breath.   Cardiovascular:  Negative for chest pain.  Neurological:  Positive for headaches.    Physical Exam Triage Vital Signs ED Triage Vitals [10/24/20 0956]  Enc Vitals Group     BP 126/81     Pulse Rate 78     Resp 20     Temp 97.8 F (36.6 C)     Temp Source Oral     SpO2 98 %     Weight      Height      Head Circumference      Peak Flow      Pain Score 0     Pain Loc      Pain Edu?      Excl. in [place]?    No data found.  Updated Vital Signs BP 126/81   Pulse 78    Temp 97.8 F (36.6 C) (Oral)   Resp 20   LMP 10/14/2020 (Approximate)   SpO2 98%   Visual Acuity Right Eye Distance:   Left Eye Distance:   Bilateral Distance:    Right Eye Near:   Left Eye Near:    Bilateral Near:     Physical Exam Vitals and nursing note reviewed.  Constitutional:      General: She is not in acute distress.    Appearance: She is not ill-appearing.  HENT:     Right Ear: Tympanic membrane normal.     Left Ear: Tympanic membrane normal.     Mouth/Throat:     Mouth: Mucous membranes are moist.     Pharynx: No posterior oropharyngeal erythema.  Cardiovascular:     Rate and Rhythm: Normal rate and regular rhythm.  Pulmonary:     Effort: Pulmonary effort is normal.     Breath sounds: Normal breath sounds. No stridor. No rhonchi.  Neurological:     Mental Status: She is alert.     UC Treatments / Results  Labs (all labs ordered are listed, but only abnormal results are displayed) Labs Reviewed - No data to display  EKG   Radiology DG Chest 2 View  Result Date: 10/24/2020 CLINICAL DATA:  Productive cough, fever EXAM: CHEST - 2 VIEW COMPARISON:  05/12/2005 FINDINGS: The heart size and mediastinal contours are within normal limits. Both lungs are clear. The visualized skeletal structures are unremarkable. IMPRESSION: No acute abnormality of the lungs. Electronically Signed   By: Lauralyn Primes M.D.   On: 10/24/2020 11:07    Procedures Procedures (including critical care time)  Medications Ordered in UC Medications - No data to display  Initial Impression / Assessment and Plan / UC Course  I have reviewed the triage vital signs and the nursing notes.  Pertinent labs & imaging results that were available during my care of the patient were reviewed by me and considered in my medical decision making (see chart for details).     1.  Mild intermittent asthma with acute exacerbation: Prednisone 20 mg orally daily for 5 days Singulair 10 mg orally  daily Mucinex 600 mg twice daily Doxycycline 1 tablet twice daily for 7 days Chest x-ray is negative for acute lung infiltrate. Final Clinical Impressions(s) / UC Diagnoses   Final diagnoses:  Acute bronchitis, unspecified organism  Asthma exacerbation, non-allergic, mild  intermittent     Discharge Instructions      Please take medications as recommended Chest x-ray does not show pneumonia If your symptoms worsen please return to urgent care to be reevaluated We will call you with recommendations if labs are abnormal.   ED Prescriptions     Medication Sig Dispense Auth. Provider   guaiFENesin (MUCINEX) 600 MG 12 hr tablet Take 1 tablet (600 mg total) by mouth 2 (two) times daily for 15 days. 30 tablet Necie Wilcoxson, Britta Mccreedy, MD   predniSONE (DELTASONE) 20 MG tablet Take 1 tablet (20 mg total) by mouth daily for 5 days. 5 tablet Thayer Embleton, Britta Mccreedy, MD   montelukast (SINGULAIR) 10 MG tablet Take 1 tablet (10 mg total) by mouth at bedtime. 30 tablet Sage Hammill, Britta Mccreedy, MD   doxycycline (VIBRAMYCIN) 100 MG capsule Take 1 capsule (100 mg total) by mouth 2 (two) times daily for 7 days. 14 capsule Sahory Nordling, Britta Mccreedy, MD      PDMP not reviewed this encounter.   Merrilee Jansky, MD 10/25/20 [phone]

## 2020-12-05 ENCOUNTER — Encounter: Payer: Self-pay | Admitting: Emergency Medicine

## 2020-12-05 ENCOUNTER — Ambulatory Visit
Admission: EM | Admit: 2020-12-05 | Discharge: 2020-12-05 | Disposition: A | Discharge status: Home / Self Care | Payer: 59 | Attending: Urgent Care | Admitting: Urgent Care

## 2020-12-05 ENCOUNTER — Other Ambulatory Visit: Payer: Self-pay

## 2020-12-05 DIAGNOSIS — J3089 Other allergic rhinitis: Secondary | ICD-10-CM

## 2020-12-05 DIAGNOSIS — J069 Acute upper respiratory infection, unspecified: Secondary | ICD-10-CM

## 2020-12-05 DIAGNOSIS — J4530 Mild persistent asthma, uncomplicated: Secondary | ICD-10-CM

## 2020-12-05 MED ORDER — BENZONATATE 100 MG PO CAPS: 100.0000 mg | ORAL_CAPSULE | Freq: Three times a day (TID) | ORAL | 0 refills | Status: DC | PRN

## 2020-12-05 MED ORDER — PROMETHAZINE-DM 6.25-15 MG/5ML PO SYRP
5.0000 mL | ORAL_SOLUTION | Freq: Every evening | ORAL | 0 refills | Status: DC | PRN
Start: 2020-12-05 — End: 2021-12-11

## 2020-12-05 MED ORDER — PREDNISONE 20 MG PO TABS
ORAL_TABLET | ORAL | 0 refills | Status: DC
Start: 2020-12-05 — End: 2021-01-30

## 2020-12-05 MED ORDER — ALBUTEROL SULFATE HFA 108 (90 BASE) MCG/ACT IN AERS: 1.0000 | INHALATION_SPRAY | Freq: Four times a day (QID) | RESPIRATORY_TRACT | 0 refills | Status: DC | PRN

## 2020-12-05 MED ORDER — MONTELUKAST SODIUM 10 MG PO TABS
10.0000 mg | ORAL_TABLET | Freq: Every day | ORAL | 0 refills | Status: DC
Start: 2020-12-05 — End: 2021-12-11

## 2020-12-05 NOTE — Discharge Instructions (Addendum)

## 2020-12-05 NOTE — ED Triage Notes (Signed)
Patient c/o headache, body aches, non-productive cough since Monday.  Patient does have a history of asthma.  Patient is not vaccinated for COVID.  Rapid COVID test was negative last week.

## 2020-12-05 NOTE — ED Provider Notes (Signed)
[facility]   MRN: [id number] DOB: Aug 07, 2000  Subjective:   Patricia [name] is a 20 y.o. female presenting for 4-day history of acute onset sinus headaches, body aches, chills, cough that elicits chest tightness, chest pain.  Patient has a history of asthma, needs a refill on her albuterol inhaler.  She also has allergies, takes Zyrtec daily, needs a refill on her montelukast as well.  She took a rapid COVID test and was negative.  Would like to make sure she gets checked for this again.  She is not vaccinated against COVID.  No current facility-administered medications for this encounter.  Current Outpatient Medications:    albuterol (PROVENTIL) (2.5 MG/3ML) 0.083% nebulizer solution, Take 3 mLs (2.5 mg total) by nebulization every 6 (six) hours as needed for wheezing or shortness of breath., Disp: 75 mL, Rfl: 12   albuterol (VENTOLIN HFA) 108 (90 Base) MCG/ACT inhaler, Inhale 1-2 puffs into the lungs every 6 (six) hours as needed for wheezing or shortness of breath., Disp: 18 g, Rfl: 0   etonogestrel (NEXPLANON) 68 MG IMPL implant, 1 each by Subdermal route once., Disp: , Rfl:    fluticasone (FLONASE) 50 MCG/ACT nasal spray, Place 1-2 sprays into both nostrils daily., Disp: 16 g, Rfl: 0   montelukast (SINGULAIR) 10 MG tablet, Take 1 tablet (10 mg total) by mouth at bedtime., Disp: 30 tablet, Rfl: 1   Cetirizine HCl 10 MG CAPS, Take 1 capsule (10 mg total) by mouth daily for 10 days., Disp: 10 capsule, Rfl: 0   Nebulizer System All-In-One MISC, 1 application by Does not apply route every 4 (four) hours as needed. Use with nebulized albuterol., Disp: 1 each, Rfl: 0   No Known Allergies  Past Medical History:  Diagnosis Date   Asthma      Past Surgical History:  Procedure Laterality Date   TONSILLECTOMY     TYMPANOSTOMY TUBE PLACEMENT      Family History  Problem Relation Age of Onset   Kidney Stones Mother    Asthma Father     Social History   Tobacco Use    Smoking status: Every Day    Types: Cigars   Smokeless tobacco: Never  Vaping Use   Vaping Use: Never used  Substance Use Topics   Alcohol use: Yes    Comment: occasionally   Drug use: Yes    Types: Marijuana    ROS   Objective:   Vitals: BP 128/71 (BP Location: Right Arm)   Pulse 81   Temp 98.3 F (36.8 C) (Oral)   Ht 5\' 3"  (1.6 m)   Wt 165 lb (74.8 kg)   SpO2 97%   BMI 29.23 kg/m   Physical Exam Constitutional:      General: She is not in acute distress.    Appearance: Normal appearance. She is well-developed. She is not ill-appearing, toxic-appearing or diaphoretic.  HENT:     Head: Normocephalic and atraumatic.     Right Ear: Tympanic membrane and ear canal normal. No drainage or tenderness. No middle ear effusion. Tympanic membrane is not erythematous.     Left Ear: Tympanic membrane and ear canal normal. No drainage or tenderness.  No middle ear effusion. Tympanic membrane is not erythematous.     Nose: Congestion and rhinorrhea present.     Mouth/Throat:     Mouth: Mucous membranes are moist. No oral lesions.     Pharynx: No pharyngeal swelling, oropharyngeal exudate, posterior oropharyngeal erythema or uvula swelling.  Tonsils: No tonsillar exudate or tonsillar abscesses.  Eyes:     General: No scleral icterus.       Right eye: No discharge.        Left eye: No discharge.     Extraocular Movements: Extraocular movements intact.     Right eye: Normal extraocular motion.     Left eye: Normal extraocular motion.     Conjunctiva/sclera: Conjunctivae normal.     Pupils: Pupils are equal, round, and reactive to light.  Neck:     Meningeal: Brudzinski's sign and Kernig's sign absent.  Cardiovascular:     Rate and Rhythm: Normal rate and regular rhythm.     Pulses: Normal pulses.     Heart sounds: Normal heart sounds. No murmur heard.   No friction rub. No gallop.  Pulmonary:     Effort: Pulmonary effort is normal. No respiratory distress.     Breath  sounds: No stridor. No wheezing, rhonchi or rales.     Comments: Slightly decreased lung sounds in bibasilar sounds.  Musculoskeletal:     Cervical back: Normal range of motion and neck supple. No rigidity.  Lymphadenopathy:     Cervical: No cervical adenopathy.  Skin:    General: Skin is warm and dry.     Findings: No rash.  Neurological:     General: No focal deficit present.     Mental Status: She is alert and oriented to person, place, and time.     Cranial Nerves: No cranial nerve deficit or facial asymmetry.     Motor: No weakness.     Coordination: Coordination normal.     Gait: Gait normal.  Psychiatric:        Mood and Affect: Mood normal.        Behavior: Behavior normal.        Thought Content: Thought content normal.        Judgment: Judgment normal.     Assessment and Plan :   PDMP not reviewed this encounter.  1. Viral URI with cough   2. Mild persistent asthma without complication   3. Allergic rhinitis due to other allergic trigger, unspecified seasonality     In light of patient's asthma, frequent use of her albuterol inhaler and allergic rhinitis we will use an oral prednisone course.  Refilled her albuterol inhaler, montelukast.  Recommend supportive care otherwise.  Deferred imaging given clear cardiopulmonary exam.  We will otherwise manage for viral respiratory illness, COVID-19 testing pending. Counseled patient on potential for adverse effects with medications prescribed/recommended today, ER and return-to-clinic precautions discussed, patient verbalized understanding.    Wallis Bamberg, [place] 12/05/20 8032

## 2020-12-06 LAB — NOVEL CORONAVIRUS, NAA: SARS-CoV-2, NAA: NOT DETECTED

## 2020-12-06 LAB — SARS-COV-2, NAA 2 DAY TAT

## 2021-01-30 ENCOUNTER — Encounter (HOSPITAL_COMMUNITY): Payer: Self-pay | Admitting: Emergency Medicine

## 2021-01-30 ENCOUNTER — Ambulatory Visit (HOSPITAL_COMMUNITY)
Admission: EM | Admit: 2021-01-30 | Discharge: 2021-01-30 | Disposition: A | Discharge status: Home / Self Care | Payer: 59 | Attending: Emergency Medicine | Admitting: Emergency Medicine

## 2021-01-30 ENCOUNTER — Other Ambulatory Visit: Payer: Self-pay

## 2021-01-30 DIAGNOSIS — J4521 Mild intermittent asthma with (acute) exacerbation: Secondary | ICD-10-CM | POA: Diagnosis not present

## 2021-01-30 MED ORDER — PREDNISONE 20 MG PO TABS: 40.0000 mg | ORAL_TABLET | Freq: Every day | ORAL | 0 refills | Status: AC

## 2021-01-30 MED ORDER — ALBUTEROL SULFATE HFA 108 (90 BASE) MCG/ACT IN AERS: 1.0000 | INHALATION_SPRAY | Freq: Four times a day (QID) | RESPIRATORY_TRACT | 0 refills | Status: DC | PRN

## 2021-01-30 NOTE — ED Provider Notes (Signed)
[facility]    CSN: [id number] Arrival date & time: 01/30/21  1030      History   Chief Complaint Chief Complaint  Patient presents with   Asthma    HPI Patricia [name] is a 20 y.o. female.   Patient here for evaluation of possible asthma exacerbation.  Reports having some coughing, wheezing, and shortness of breath.  Reports that she only had about 1 or 2 puffs left in her inhaler but it did help to improve her symptoms.  Reports similar symptoms with asthma exacerbations in the past.  Denies any recent sick contacts.  Denies any nasal congestion or fevers.  Denies any trauma, injury, or other precipitating event.  Denies any specific alleviating or aggravating factors.  Denies any fevers, chest pain, N/V/D, numbness, tingling, weakness, abdominal pain, or headaches.    The history is provided by the patient.  Asthma   Past Medical History:  Diagnosis Date   Asthma     There are no problems to display for this patient.   Past Surgical History:  Procedure Laterality Date   TONSILLECTOMY     TYMPANOSTOMY TUBE PLACEMENT      OB History   No obstetric history on file.      Home Medications    Prior to Admission medications   Medication Sig Start Date End Date Taking? Authorizing Provider  albuterol (VENTOLIN HFA) 108 (90 Base) MCG/ACT inhaler Inhale 1-2 puffs into the lungs every 6 (six) hours as needed for wheezing or shortness of breath. 01/30/21  Yes Ivette Loyal, NP  predniSONE (DELTASONE) 20 MG tablet Take 2 tablets (40 mg total) by mouth daily for 5 days. 01/30/21 02/04/21 Yes Ivette Loyal, NP  albuterol (PROVENTIL) (2.5 MG/3ML) 0.083% nebulizer solution Take 3 mLs (2.5 mg total) by nebulization every 6 (six) hours as needed for wheezing or shortness of breath. Patient not taking: Reported on 01/30/2021 08/22/20   Valinda Hoar, NP  benzonatate (TESSALON) 100 MG capsule Take 1-2 capsules (100-200 mg total) by mouth 3 (three) times daily as needed  for cough. 12/05/20   Wallis Bamberg, PA-C  Cetirizine HCl 10 MG CAPS Take 1 capsule (10 mg total) by mouth daily for 10 days. Patient not taking: Reported on 01/30/2021 08/22/20 10/24/20  Valinda Hoar, NP  etonogestrel (NEXPLANON) 68 MG IMPL implant 1 each by Subdermal route once.    [provider]  fluticasone (FLONASE) 50 MCG/ACT nasal spray Place 1-2 sprays into both nostrils daily. Patient not taking: Reported on 01/30/2021 06/11/20   Wieters, Hallie C, PA-C  montelukast (SINGULAIR) 10 MG tablet Take 1 tablet (10 mg total) by mouth at bedtime. Patient not taking: Reported on 01/30/2021 12/05/20   Wallis Bamberg, PA-C  Nebulizer System All-In-One MISC 1 application by Does not apply route every 4 (four) hours as needed. Use with nebulized albuterol. 08/22/20   Valinda Hoar, NP  promethazine-dextromethorphan (PROMETHAZINE-DM) 6.25-15 MG/5ML syrup Take 5 mLs by mouth at bedtime as needed for cough. 12/05/20   Wallis Bamberg, PA-C  loratadine (CLARITIN) 10 MG tablet Take 1 tablet (10 mg total) by mouth daily. Patient not taking: No sig reported 12/07/11 06/11/20  Johnsie Kindred, NP    Family History Family History  Problem Relation Age of Onset   Kidney Stones Mother    Asthma Father     Social History Social History   Tobacco Use   Smoking status: Every Day    Types: Cigars   Smokeless tobacco:  Never  Vaping Use   Vaping Use: Never used  Substance Use Topics   Alcohol use: Yes    Comment: occasionally   Drug use: Yes    Types: Marijuana     Allergies   Patient has no known allergies.   Review of Systems Review of Systems  Respiratory:  Positive for cough and wheezing.   All other systems reviewed and are negative.   Physical Exam Triage Vital Signs ED Triage Vitals  Enc Vitals Group     BP 01/30/21 1243 (!) 120/57     Pulse Rate 01/30/21 1243 81     Resp 01/30/21 1243 20     Temp 01/30/21 1243 98.8 F (37.1 C)     Temp src --      SpO2 01/30/21 1243 98 %      Weight --      Height --      Head Circumference --      Peak Flow --      Pain Score 01/30/21 1239 0     Pain Loc --      Pain Edu? --      Excl. in GC? --    No data found.  Updated Vital Signs BP (!) 120/57 (BP Location: Right Arm)   Pulse 81   Temp 98.8 F (37.1 C)   Resp 20   SpO2 98%   Visual Acuity Right Eye Distance:   Left Eye Distance:   Bilateral Distance:    Right Eye Near:   Left Eye Near:    Bilateral Near:     Physical Exam Vitals and nursing note reviewed.  Constitutional:      General: She is not in acute distress.    Appearance: Normal appearance. She is not ill-appearing, toxic-appearing or diaphoretic.  HENT:     Head: Normocephalic and atraumatic.     Nose: No congestion.  Eyes:     Conjunctiva/sclera: Conjunctivae normal.  Cardiovascular:     Rate and Rhythm: Normal rate and regular rhythm.     Pulses: Normal pulses.     Heart sounds: Normal heart sounds.  Pulmonary:     Effort: Pulmonary effort is normal.     Breath sounds: Wheezing present.  Abdominal:     General: Abdomen is flat.  Musculoskeletal:        General: Normal range of motion.     Cervical back: Normal range of motion.  Skin:    General: Skin is warm and dry.  Neurological:     General: No focal deficit present.     Mental Status: She is alert and oriented to person, place, and time.  Psychiatric:        Mood and Affect: Mood normal.     UC Treatments / Results  Labs (all labs ordered are listed, but only abnormal results are displayed) Labs Reviewed - No data to display  EKG   Radiology No results found.  Procedures Procedures (including critical care time)  Medications Ordered in UC Medications - No data to display  Initial Impression / Assessment and Plan / UC Course  I have reviewed the triage vital signs and the nursing notes.  Pertinent labs & imaging results that were available during my care of the patient were reviewed by me and considered in  my medical decision making (see chart for details).    Assessment negative for red flags or concerns.  Likely acute asthma exacerbation.  We will treat with prednisone daily for  the next 5 days.  Refill of albuterol inhaler sent.  Offered patient a steroid injection in office but she declined.  May take Tylenol as needed.  Encourage fluids and rest.  Discussed conservative symptom management as described in discharge instructions.  Follow-up with primary care for reevaluation. Final Clinical Impressions(s) / UC Diagnoses   Final diagnoses:  Mild intermittent asthma with exacerbation     Discharge Instructions      Take the prednisone 2 pills a day for the next 5 days.  Take this in the morning. You may use the albuterol inhaler 1 to 2 puffs every 6 hours as needed for wheezing and shortness of breath.  You can take Tylenol as needed for fever reduction and pain relief.   For cough: honey 1/2 to 1 teaspoon (you can dilute the honey in water or another fluid).  You can also use guaifenesin and dextromethorphan for cough. You can use a humidifier for chest congestion and cough.  If you don't have a humidifier, you can sit in the bathroom with the hot shower running.    For sore throat: try warm salt water gargles, cepacol lozenges, throat spray, warm tea or water with lemon/honey, popsicles or ice, or OTC cold relief medicine for throat discomfort.  For congestion: take a daily anti-histamine like Zyrtec, Claritin, and a oral decongestant, such as pseudoephedrine.  You can also use Flonase 1-2 sprays in each nostril daily.    It is important to stay hydrated: drink plenty of fluids (water, gatorade/powerade/pedialyte, juices, or teas)  Return or go to the Emergency Department if symptoms worsen or do not improve in the next few days.      ED Prescriptions     Medication Sig Dispense Auth. Provider   albuterol (VENTOLIN HFA) 108 (90 Base) MCG/ACT inhaler Inhale 1-2 puffs into the lungs  every 6 (six) hours as needed for wheezing or shortness of breath. 18 g Ivette Loyal, NP   predniSONE (DELTASONE) 20 MG tablet Take 2 tablets (40 mg total) by mouth daily for 5 days. 10 tablet Ivette Loyal, NP      PDMP not reviewed this encounter.   Ivette Loyal, NP 01/30/21 1302

## 2021-01-30 NOTE — ED Triage Notes (Signed)
Patient has asthma and reports having issues particularly at bedtime is having issues with coughing.  Patient has phlegm with coughing, runny nose and headache (minor).  Patient reports she does not have an inhaler

## 2021-01-30 NOTE — Discharge Instructions (Signed)
Take the prednisone 2 pills a day for the next 5 days.  Take this in the morning. You may use the albuterol inhaler 1 to 2 puffs every 6 hours as needed for wheezing and shortness of breath.  You can take Tylenol as needed for fever reduction and pain relief.   For cough: honey 1/2 to 1 teaspoon (you can dilute the honey in water or another fluid).  You can also use guaifenesin and dextromethorphan for cough. You can use a humidifier for chest congestion and cough.  If you don't have a humidifier, you can sit in the bathroom with the hot shower running.    For sore throat: try warm salt water gargles, cepacol lozenges, throat spray, warm tea or water with lemon/honey, popsicles or ice, or OTC cold relief medicine for throat discomfort.  For congestion: take a daily anti-histamine like Zyrtec, Claritin, and a oral decongestant, such as pseudoephedrine.  You can also use Flonase 1-2 sprays in each nostril daily.    It is important to stay hydrated: drink plenty of fluids (water, gatorade/powerade/pedialyte, juices, or teas)  Return or go to the Emergency Department if symptoms worsen or do not improve in the next few days.

## 2021-02-20 ENCOUNTER — Encounter (HOSPITAL_COMMUNITY): Payer: Self-pay

## 2021-02-20 ENCOUNTER — Emergency Department (HOSPITAL_COMMUNITY)
Admission: EM | Admit: 2021-02-20 | Discharge: 2021-02-20 | Disposition: A | Discharge status: Home / Self Care | Payer: 59 | Attending: Emergency Medicine | Admitting: Emergency Medicine

## 2021-02-20 ENCOUNTER — Other Ambulatory Visit: Payer: Self-pay

## 2021-02-20 DIAGNOSIS — Z20822 Contact with and (suspected) exposure to covid-19: Secondary | ICD-10-CM | POA: Diagnosis not present

## 2021-02-20 DIAGNOSIS — R509 Fever, unspecified: Secondary | ICD-10-CM | POA: Diagnosis present

## 2021-02-20 DIAGNOSIS — J45909 Unspecified asthma, uncomplicated: Secondary | ICD-10-CM | POA: Diagnosis not present

## 2021-02-20 DIAGNOSIS — F17290 Nicotine dependence, other tobacco product, uncomplicated: Secondary | ICD-10-CM | POA: Diagnosis not present

## 2021-02-20 DIAGNOSIS — J101 Influenza due to other identified influenza virus with other respiratory manifestations: Secondary | ICD-10-CM | POA: Insufficient documentation

## 2021-02-20 DIAGNOSIS — B349 Viral infection, unspecified: Secondary | ICD-10-CM

## 2021-02-20 LAB — RESP PANEL BY RT-PCR (FLU A&B, COVID) ARPGX2
Influenza A by PCR: POSITIVE — AB
Influenza B by PCR: NEGATIVE
SARS Coronavirus 2 by RT PCR: NEGATIVE

## 2021-02-20 MED ORDER — ACETAMINOPHEN 325 MG PO TABS
650.0000 mg | ORAL_TABLET | Freq: Once | ORAL | Status: AC | PRN
  Administered 2021-02-20: 650 mg via ORAL
  Filled 2021-02-20: qty 2

## 2021-02-20 NOTE — ED Provider Notes (Signed)
[facility]-EMERGENCY DEPT Provider Note   CSN: [id number] Arrival date & time: 02/20/21  3734     History Chief Complaint  Patient presents with   Fever   Cough   Nasal Congestion    Patricia [name] is a 20 y.o. female presented emerged department with fever, cough, congestion, headache, body aches beginning yesterday.  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  She reports she has had a dry cough.  She does have a history of asthma and has been trying her albuterol inhaler with little relief.  She has not been taking any other significant over-the-counter medications or regular medications for her symptoms.  HPI     Past Medical History:  Diagnosis Date   Asthma     There are no problems to display for this patient.   Past Surgical History:  Procedure Laterality Date   TONSILLECTOMY     TYMPANOSTOMY TUBE PLACEMENT       OB History   No obstetric history on file.     Family History  Problem Relation Age of Onset   Kidney Stones Mother    Asthma Father     Social History   Tobacco Use   Smoking status: Every Day    Types: Cigars   Smokeless tobacco: Never  Vaping Use   Vaping Use: Never used  Substance Use Topics   Alcohol use: Yes    Comment: occasionally   Drug use: Yes    Types: Marijuana    Home Medications Prior to Admission medications   Medication Sig Start Date End Date Taking? Authorizing Provider  albuterol (PROVENTIL) (2.5 MG/3ML) 0.083% nebulizer solution Take 3 mLs (2.5 mg total) by nebulization every 6 (six) hours as needed for wheezing or shortness of breath. Patient not taking: Reported on 01/30/2021 08/22/20   Valinda Hoar, NP  albuterol (VENTOLIN HFA) 108 (90 Base) MCG/ACT inhaler Inhale 1-2 puffs into the lungs every 6 (six) hours as needed for wheezing or shortness of breath. 01/30/21   Ivette Loyal, NP  benzonatate (TESSALON) 100 MG capsule Take 1-2  capsules (100-200 mg total) by mouth 3 (three) times daily as needed for cough. 12/05/20   Wallis Bamberg, PA-C  Cetirizine HCl 10 MG CAPS Take 1 capsule (10 mg total) by mouth daily for 10 days. Patient not taking: Reported on 01/30/2021 08/22/20 10/24/20  Valinda Hoar, NP  etonogestrel (NEXPLANON) 68 MG IMPL implant 1 each by Subdermal route once.    [provider]  fluticasone (FLONASE) 50 MCG/ACT nasal spray Place 1-2 sprays into both nostrils daily. Patient not taking: Reported on 01/30/2021 06/11/20   Wieters, Hallie C, PA-C  montelukast (SINGULAIR) 10 MG tablet Take 1 tablet (10 mg total) by mouth at bedtime. Patient not taking: Reported on 01/30/2021 12/05/20   Wallis Bamberg, PA-C  Nebulizer System All-In-One MISC 1 application by Does not apply route every 4 (four) hours as needed. Use with nebulized albuterol. 08/22/20   Valinda Hoar, NP  promethazine-dextromethorphan (PROMETHAZINE-DM) 6.25-15 MG/5ML syrup Take 5 mLs by mouth at bedtime as needed for cough. 12/05/20   Wallis Bamberg, PA-C  loratadine (CLARITIN) 10 MG tablet Take 1 tablet (10 mg total) by mouth daily. Patient not taking: No sig reported 12/07/11 06/11/20  Johnsie Kindred, NP    Allergies    Patient has no known allergies.  Review of Systems   Review of Systems  Constitutional:  Positive for appetite change,  chills, fatigue and fever.  HENT:  Positive for congestion and sore throat.   Eyes:  Negative for pain and visual disturbance.  Respiratory:  Positive for cough and shortness of breath.   Cardiovascular:  Negative for chest pain and palpitations.  Gastrointestinal:  Negative for abdominal pain and vomiting.  Genitourinary:  Negative for dysuria and hematuria.  Musculoskeletal:  Positive for arthralgias and myalgias.  Skin:  Negative for color change and rash.  Neurological:  Positive for headaches. Negative for syncope.  All other systems reviewed and are negative.  Physical Exam Updated Vital Signs BP  128/83   Pulse (!) 113   Temp (!) 100.7 F (38.2 C) (Oral)   Resp 18   Ht 5\' 4"  (1.626 m)   Wt 77.1 kg   SpO2 97%   BMI 29.18 kg/m   Physical Exam Constitutional:      General: She is not in acute distress. HENT:     Head: Normocephalic and atraumatic.  Eyes:     Conjunctiva/sclera: Conjunctivae normal.     Pupils: Pupils are equal, round, and reactive to light.  Cardiovascular:     Rate and Rhythm: Normal rate and regular rhythm.     Pulses: Normal pulses.  Pulmonary:     Effort: Pulmonary effort is normal. No respiratory distress.     Breath sounds: Normal breath sounds. No wheezing.  Abdominal:     General: There is no distension.     Tenderness: There is no abdominal tenderness.  Skin:    General: Skin is warm and dry.  Neurological:     General: No focal deficit present.     Mental Status: She is alert and oriented to person, place, and time. Mental status is at baseline.  Psychiatric:        Mood and Affect: Mood normal.        Behavior: Behavior normal.    ED Results / Procedures / Treatments   Labs (all labs ordered are listed, but only abnormal results are displayed) Labs Reviewed  RESP PANEL BY RT-PCR (FLU A&B, COVID) ARPGX2 - Abnormal; Notable for the following components:      Result Value   Influenza A by PCR POSITIVE (*)    All other components within normal limits    EKG None  Radiology No results found.  Procedures Procedures   Medications Ordered in ED Medications  acetaminophen (TYLENOL) tablet 650 mg (650 mg Oral Given 02/20/21 [id number])    ED Course  I have reviewed the triage vital signs and the nursing notes.  Pertinent labs & imaging results that were available during my care of the patient were reviewed by me and considered in my medical decision making (see chart for details).  I strongly suspect this is a viral syndrome.  She is well-appearing clinically.  No hypoxia.  No wheezing on exam to suggest asthma exacerbation.  I doubt  sepsis, bacterial pneumonia, meningitis, bacteremia.  I would advise continued supportive care at home with over-the-counter cold and flu medications.  We will test her for COVID and influenza.  She will follow-up on results.  A work note was provided if she test positive.  We discussed quarantine measures at home.  She verbalized understanding.  Final Clinical Impression(s) / ED Diagnoses Final diagnoses:  Viral illness    Rx / DC Orders ED Discharge Orders     None        Stellarose Cerny, Carola Rhine, MD 02/20/21 [phone]

## 2021-02-20 NOTE — Discharge Instructions (Addendum)
You take over-the-counter cold and flu medications, consider DayQuil or NyQuil or similar over-the-counter products, for your symptoms.  Please follow-up in your COVID and flu results later this afternoon on your patient portal online.  If you test positive, you should quarantine at home for 7 days from the start of your symptoms.

## 2021-02-20 NOTE — ED Triage Notes (Signed)
Patient c/o fever, a productive cough with white sputum, headache, and body aches.

## 2021-02-21 ENCOUNTER — Telehealth (HOSPITAL_COMMUNITY): Payer: Self-pay | Admitting: Emergency Medicine

## 2021-02-21 MED ORDER — ALBUTEROL SULFATE (2.5 MG/3ML) 0.083% IN NEBU: 2.5000 mg | INHALATION_SOLUTION | Freq: Four times a day (QID) | RESPIRATORY_TRACT | 12 refills | Status: AC | PRN

## 2021-02-21 NOTE — Telephone Encounter (Signed)
Needs nebulizer meds

## 2021-02-24 ENCOUNTER — Emergency Department (HOSPITAL_COMMUNITY)
Admission: EM | Admit: 2021-02-24 | Discharge: 2021-02-24 | Disposition: A | Discharge status: Home / Self Care | Payer: 59 | Attending: Emergency Medicine | Admitting: Emergency Medicine

## 2021-02-24 ENCOUNTER — Encounter (HOSPITAL_COMMUNITY): Payer: Self-pay

## 2021-02-24 DIAGNOSIS — J45909 Unspecified asthma, uncomplicated: Secondary | ICD-10-CM | POA: Diagnosis not present

## 2021-02-24 DIAGNOSIS — J101 Influenza due to other identified influenza virus with other respiratory manifestations: Secondary | ICD-10-CM | POA: Insufficient documentation

## 2021-02-24 DIAGNOSIS — F17290 Nicotine dependence, other tobacco product, uncomplicated: Secondary | ICD-10-CM | POA: Diagnosis not present

## 2021-02-24 DIAGNOSIS — R062 Wheezing: Secondary | ICD-10-CM

## 2021-02-24 DIAGNOSIS — J111 Influenza due to unidentified influenza virus with other respiratory manifestations: Secondary | ICD-10-CM

## 2021-02-24 DIAGNOSIS — R059 Cough, unspecified: Secondary | ICD-10-CM | POA: Diagnosis present

## 2021-02-24 MED ORDER — PREDNISONE 20 MG PO TABS
60.0000 mg | ORAL_TABLET | Freq: Once | ORAL | Status: AC
  Administered 2021-02-24: 19:00:00 60 mg via ORAL
  Filled 2021-02-24: qty 3

## 2021-02-24 MED ORDER — PREDNISONE 10 MG PO TABS: 40.0000 mg | ORAL_TABLET | Freq: Every day | ORAL | 0 refills | Status: AC

## 2021-02-24 NOTE — ED Provider Notes (Signed)
[facility]-EMERGENCY DEPT Provider Note   CSN: [id number] Arrival date & time: 02/24/21  1544     History Chief Complaint  Patient presents with   Influenza    Patricia [name] is a 20 y.o. female.  HPI Patient is a 20 year old female with a history of asthma who presents to the emergency department due to cough and wheezing.  Patient states that she was diagnosed with influenza A 4 days ago.  Her symptoms began about 5 days ago.  States her cough is productive with yellow sputum.  Reports associated wheezing and shortness of breath.  She has been using her nebulizers at home with mild intermittent relief but notes that she is not getting complete resolution of her symptoms.  No nausea, vomiting, diarrhea, abdominal pain.    Past Medical History:  Diagnosis Date   Asthma     There are no problems to display for this patient.   Past Surgical History:  Procedure Laterality Date   TONSILLECTOMY     TYMPANOSTOMY TUBE PLACEMENT       OB History   No obstetric history on file.     Family History  Problem Relation Age of Onset   Kidney Stones Mother    Asthma Father     Social History   Tobacco Use   Smoking status: Every Day    Types: Cigars   Smokeless tobacco: Never  Vaping Use   Vaping Use: Never used  Substance Use Topics   Alcohol use: Yes    Comment: occasionally   Drug use: Yes    Types: Marijuana    Home Medications Prior to Admission medications   Medication Sig Start Date End Date Taking? Authorizing Provider  predniSONE (DELTASONE) 10 MG tablet Take 4 tablets (40 mg total) by mouth daily with breakfast for 4 days. 02/24/21 02/28/21 Yes Placido Sou, PA-C  albuterol (PROVENTIL) (2.5 MG/3ML) 0.083% nebulizer solution Take 3 mLs (2.5 mg total) by nebulization every 6 (six) hours as needed for wheezing or shortness of breath. Patient not taking: Reported on 01/30/2021 08/22/20   Valinda Hoar, NP  albuterol (PROVENTIL) (2.5  MG/3ML) 0.083% nebulizer solution Take 3 mLs (2.5 mg total) by nebulization every 6 (six) hours as needed for wheezing or shortness of breath. 02/21/21   Terald Sleeper, MD  albuterol (VENTOLIN HFA) 108 (90 Base) MCG/ACT inhaler Inhale 1-2 puffs into the lungs every 6 (six) hours as needed for wheezing or shortness of breath. 01/30/21   Ivette Loyal, NP  benzonatate (TESSALON) 100 MG capsule Take 1-2 capsules (100-200 mg total) by mouth 3 (three) times daily as needed for cough. 12/05/20   Wallis Bamberg, PA-C  Cetirizine HCl 10 MG CAPS Take 1 capsule (10 mg total) by mouth daily for 10 days. Patient not taking: Reported on 01/30/2021 08/22/20 10/24/20  Valinda Hoar, NP  etonogestrel (NEXPLANON) 68 MG IMPL implant 1 each by Subdermal route once.    [provider]  fluticasone (FLONASE) 50 MCG/ACT nasal spray Place 1-2 sprays into both nostrils daily. Patient not taking: Reported on 01/30/2021 06/11/20   Wieters, Hallie C, PA-C  montelukast (SINGULAIR) 10 MG tablet Take 1 tablet (10 mg total) by mouth at bedtime. Patient not taking: Reported on 01/30/2021 12/05/20   Wallis Bamberg, PA-C  Nebulizer System All-In-One MISC 1 application by Does not apply route every 4 (four) hours as needed. Use with nebulized albuterol. 08/22/20   Valinda Hoar, NP  promethazine-dextromethorphan (PROMETHAZINE-DM) 6.25-15 MG/5ML syrup  Take 5 mLs by mouth at bedtime as needed for cough. 12/05/20   Wallis Bamberg, PA-C  loratadine (CLARITIN) 10 MG tablet Take 1 tablet (10 mg total) by mouth daily. Patient not taking: No sig reported 12/07/11 06/11/20  Johnsie Kindred, NP    Allergies    Patient has no known allergies.  Review of Systems   Review of Systems  Constitutional:  Positive for chills, fatigue and fever.  HENT:  Positive for congestion and rhinorrhea.   Respiratory:  Positive for cough, shortness of breath and wheezing.   Cardiovascular:  Negative for chest pain.  Gastrointestinal:  Negative for  abdominal pain, diarrhea, nausea and vomiting.   Physical Exam Updated Vital Signs BP 127/80 (BP Location: Right Arm)   Pulse 88   Temp 99.7 F (37.6 C) (Oral)   Resp 16   LMP 02/17/2021 (Approximate)   SpO2 99%   Physical Exam Vitals and nursing note reviewed.  Constitutional:      General: She is not in acute distress.    Appearance: Normal appearance. She is not ill-appearing, toxic-appearing or diaphoretic.  HENT:     Head: Normocephalic and atraumatic.     Right Ear: External ear normal.     Left Ear: External ear normal.     Nose: Nose normal.     Mouth/Throat:     Mouth: Mucous membranes are moist.     Pharynx: Oropharynx is clear. No oropharyngeal exudate or posterior oropharyngeal erythema.  Eyes:     Extraocular Movements: Extraocular movements intact.  Cardiovascular:     Rate and Rhythm: Normal rate and regular rhythm.     Pulses: Normal pulses.     Heart sounds: Normal heart sounds. No murmur heard.   No friction rub. No gallop.  Pulmonary:     Effort: Pulmonary effort is normal. No respiratory distress.     Breath sounds: No stridor. Wheezing present. No rhonchi or rales.     Comments: Faint expiratory wheezes noted in the left posterior lung fields.  Otherwise lungs are clear.  Oxygen saturations in the high 90s on room air.  Speaking clearly and coherently.  No respiratory distress noted. Abdominal:     General: Abdomen is flat.     Tenderness: There is no abdominal tenderness.  Musculoskeletal:        General: Normal range of motion.     Cervical back: Normal range of motion and neck supple. No tenderness.  Skin:    General: Skin is warm and dry.  Neurological:     General: No focal deficit present.     Mental Status: She is alert and oriented to person, place, and time.  Psychiatric:        Mood and Affect: Mood normal.        Behavior: Behavior normal.   ED Results / Procedures / Treatments   Labs (all labs ordered are listed, but only abnormal  results are displayed) Labs Reviewed - No data to display  EKG None  Radiology No results found.  Procedures Procedures   Medications Ordered in ED Medications  predniSONE (DELTASONE) tablet 60 mg (has no administration in time range)    ED Course  I have reviewed the triage vital signs and the nursing notes.  Pertinent labs & imaging results that were available during my care of the patient were reviewed by me and considered in my medical decision making (see chart for details).    MDM Rules/Calculators/A&P  Patient is a 20 year old female who presents to the emergency department with cough and wheezing.  Symptoms started about 5 days ago and patient was diagnosed with influenza A 4 days ago.  On my exam patient has faint expiratory wheezes in the left posterior lung field.  Otherwise lungs are clear.  No respiratory distress noted at this time.  Oxygen saturations in the high 90s on room air.  Patient denies any nausea, vomiting, or diarrhea.  Patient states that she has nebulizers at home but was never given prednisone.  No history of diabetes mellitus.  Will discharge on a prednisone burst.  First dose given in the emergency department.  Feel that she is stable for discharge at this time and she is agreeable.  She was given strict return precautions.  Her questions were answered and she was amicable at the time of discharge.  Final Clinical Impression(s) / ED Diagnoses Final diagnoses:  Influenza  Wheezing   Rx / DC Orders ED Discharge Orders          Ordered    predniSONE (DELTASONE) 10 MG tablet  Daily with breakfast        02/24/21 1810             Placido Sou, PA-C 02/24/21 1817    Vanetta Mulders, MD 02/27/21 1914

## 2021-02-24 NOTE — Discharge Instructions (Addendum)
I am prescribing you a strong steroid medication called prednisone.  Please only take this as prescribed.  Please take it early in the morning, as this medication can be stimulating and make it difficult to sleep at night.  Please continue to use your nebulizers as needed.  Please return to the emergency department with any new or worsening symptoms.

## 2021-02-24 NOTE — ED Triage Notes (Signed)
Pt presents with c/o positive influenza last Thursday. Pt is still coughing and reports she was told then she was having an asthma flare-up but did not receive any medication for that on that day.

## 2021-04-15 ENCOUNTER — Encounter (HOSPITAL_COMMUNITY): Payer: Self-pay

## 2021-04-15 ENCOUNTER — Other Ambulatory Visit: Payer: Self-pay

## 2021-04-15 ENCOUNTER — Emergency Department (HOSPITAL_COMMUNITY)
Admission: EM | Admit: 2021-04-15 | Discharge: 2021-04-15 | Disposition: A | Discharge status: Home / Self Care | Payer: 59 | Attending: Emergency Medicine | Admitting: Emergency Medicine

## 2021-04-15 ENCOUNTER — Emergency Department (HOSPITAL_COMMUNITY): Payer: 59

## 2021-04-15 DIAGNOSIS — S0993XA Unspecified injury of face, initial encounter: Secondary | ICD-10-CM | POA: Insufficient documentation

## 2021-04-15 DIAGNOSIS — Z760 Encounter for issue of repeat prescription: Secondary | ICD-10-CM | POA: Insufficient documentation

## 2021-04-15 DIAGNOSIS — S0592XA Unspecified injury of left eye and orbit, initial encounter: Secondary | ICD-10-CM | POA: Diagnosis present

## 2021-04-15 DIAGNOSIS — H1132 Conjunctival hemorrhage, left eye: Secondary | ICD-10-CM | POA: Insufficient documentation

## 2021-04-15 DIAGNOSIS — Z7951 Long term (current) use of inhaled steroids: Secondary | ICD-10-CM | POA: Insufficient documentation

## 2021-04-15 DIAGNOSIS — W501XXA Accidental kick by another person, initial encounter: Secondary | ICD-10-CM | POA: Diagnosis not present

## 2021-04-15 DIAGNOSIS — S0012XA Contusion of left eyelid and periocular area, initial encounter: Secondary | ICD-10-CM | POA: Insufficient documentation

## 2021-04-15 MED ORDER — TETRACAINE HCL 0.5 % OP SOLN
2.0000 [drp] | Freq: Once | OPHTHALMIC | Status: AC
  Administered 2021-04-15: 2 [drp] via OPHTHALMIC
  Filled 2021-04-15: qty 4

## 2021-04-15 MED ORDER — FLUORESCEIN SODIUM 1 MG OP STRP
1.0000 | ORAL_STRIP | Freq: Once | OPHTHALMIC | Status: AC
  Administered 2021-04-15: 1 via OPHTHALMIC
  Filled 2021-04-15: qty 1

## 2021-04-15 MED ORDER — ALBUTEROL SULFATE HFA 108 (90 BASE) MCG/ACT IN AERS: 1.0000 | INHALATION_SPRAY | Freq: Four times a day (QID) | RESPIRATORY_TRACT | 0 refills | Status: AC | PRN
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
## 2021-04-15 NOTE — Discharge Instructions (Signed)
You came to the emergency department today to be evaluated for your facial injury.  Your CT scan showed no broken bones or dislocations.  Your physical exam was reassuring.  Please apply ice to the swelling underneath your left eye for 20 minutes at a time and then give yourself a 20-minute break before reapplying ice.  Please take Ibuprofen (Advil, motrin) and Tylenol (acetaminophen) to relieve your pain.    You may take up to 600 MG (3 pills) of normal strength ibuprofen every 8 hours as needed.   You make take tylenol, up to 1,000 mg (two extra strength pills) every 8 hours as needed.   It is safe to take ibuprofen and tylenol at the same time as they work differently.   Do not take more than 3,000 mg tylenol in a 24 hour period (not more than one dose every 8 hours.  Please check all medication labels as many medications such as pain and cold medications may contain tylenol.  Do not drink alcohol while taking these medications.  Do not take other NSAID'S while taking ibuprofen (such as aleve or naproxen).  Please take ibuprofen with food to decrease stomach upset.  Get help right away if: Your pain is worse or your pain is not controlled with medicine. Your skin over the hematoma breaks or starts bleeding. Your hematoma is in your chest or abdomen and you have weakness, shortness of breath, or a change in consciousness. You have a hematoma on your scalp that is caused by a fall or injury, and you also have: A headache that gets worse. Trouble speaking or understanding speech. Weakness. Change in alertness or consciousness. You develop visual changes including double vision, blurred vision, or vision loss You start having pain to your eye. You are unable to move your left eye normally

## 2021-12-09 ENCOUNTER — Emergency Department (HOSPITAL_COMMUNITY)
Admission: EM | Admit: 2021-12-09 | Discharge: 2021-12-09 | Disposition: A | Discharge status: Home / Self Care | Payer: 59 | Attending: Emergency Medicine | Admitting: Emergency Medicine

## 2021-12-09 ENCOUNTER — Encounter (HOSPITAL_COMMUNITY): Payer: Self-pay

## 2021-12-09 ENCOUNTER — Other Ambulatory Visit: Payer: Self-pay

## 2021-12-09 ENCOUNTER — Other Ambulatory Visit: Payer: Self-pay | Admitting: *Deleted

## 2021-12-09 ENCOUNTER — Emergency Department (HOSPITAL_COMMUNITY): Payer: 59

## 2021-12-09 DIAGNOSIS — R350 Frequency of micturition: Secondary | ICD-10-CM | POA: Diagnosis not present

## 2021-12-09 DIAGNOSIS — Z3A01 Less than 8 weeks gestation of pregnancy: Secondary | ICD-10-CM | POA: Diagnosis not present

## 2021-12-09 DIAGNOSIS — O209 Hemorrhage in early pregnancy, unspecified: Secondary | ICD-10-CM | POA: Insufficient documentation

## 2021-12-09 DIAGNOSIS — R1030 Lower abdominal pain, unspecified: Secondary | ICD-10-CM | POA: Diagnosis not present

## 2021-12-09 DIAGNOSIS — N9489 Other specified conditions associated with female genital organs and menstrual cycle: Secondary | ICD-10-CM | POA: Diagnosis not present

## 2021-12-09 DIAGNOSIS — O4690 Antepartum hemorrhage, unspecified, unspecified trimester: Secondary | ICD-10-CM

## 2021-12-09 LAB — BASIC METABOLIC PANEL
Anion gap: 6 (ref 5–15)
BUN: 17 mg/dL (ref 6–20)
CO2: 24 mmol/L (ref 22–32)
Calcium: 9 mg/dL (ref 8.9–10.3)
Chloride: 109 mmol/L (ref 98–111)
Creatinine, Ser: 0.69 mg/dL (ref 0.44–1.00)
GFR, Estimated: >60 mL/min (ref >60)
Glucose, Bld: 124 mg/dL — ABNORMAL HIGH (ref 70–99)
Potassium: 4 mmol/L (ref 3.5–5.1)
Sodium: 139 mmol/L (ref 135–145)

## 2021-12-09 LAB — URINALYSIS, ROUTINE W REFLEX MICROSCOPIC
Bilirubin Urine: NEGATIVE
Glucose, UA: NEGATIVE mg/dL
Ketones, ur: NEGATIVE mg/dL
Leukocytes,Ua: NEGATIVE
Nitrite: NEGATIVE
Protein, ur: NEGATIVE mg/dL
Specific Gravity, Urine: 1.025 (ref 1.005–1.030)
pH: 5 (ref 5.0–8.0)

## 2021-12-09 LAB — WET PREP, GENITAL
Clue Cells Wet Prep HPF POC: NONE SEEN
Sperm: NONE SEEN
Trich, Wet Prep: NONE SEEN
WBC, Wet Prep HPF POC: <10 (ref <10)
Yeast Wet Prep HPF POC: NONE SEEN

## 2021-12-09 LAB — CBC
HCT: 41.3 % (ref 36.0–46.0)
Hemoglobin: 13.9 g/dL (ref 12.0–15.0)
MCH: 29.9 pg (ref 26.0–34.0)
MCHC: 33.7 g/dL (ref 30.0–36.0)
MCV: 88.8 fL (ref 80.0–100.0)
Platelets: 223 10*3/uL (ref 150–400)
RBC: 4.65 MIL/uL (ref 3.87–5.11)
RDW: 13.4 % (ref 11.5–15.5)
WBC: 11.5 10*3/uL — ABNORMAL HIGH (ref 4.0–10.5)
nRBC: 0 % (ref 0.0–0.2)

## 2021-12-09 LAB — HCG, QUANTITATIVE, PREGNANCY: hCG, Beta Chain, Quant, S: 3740 m[IU]/mL — ABNORMAL HIGH (ref <5)

## 2021-12-09 NOTE — ED Notes (Signed)
Pt states understanding of dc instructions, importance of follow up.  Pt denies questions or concerns upon dc. Pt declined wheelchair assistance upon dc. Pt ambulated out of ed w/ steady gait. No belongings left in room upon dc.  

## 2021-12-09 NOTE — ED Triage Notes (Signed)
Pt reports vaginal bleeding with abd cramping x1 day. Pt [redacted] weeks pregnant and first pregnancy.

## 2021-12-09 NOTE — Discharge Instructions (Signed)
Please follow-up with OB as discussed They should be calling you to schedule ultrasound in the next 2 weeks Return to emergency department or to you at [facility] if you have any worsening symptoms especially and increasing pain or bleeding

## 2021-12-09 NOTE — ED Provider Notes (Signed)
[facility]-EMERGENCY DEPT Provider Note   CSN: [id number] Arrival date & time: 12/09/21  7858     History  Chief Complaint  Patient presents with   Vaginal Bleeding    Patricia [name] is a 21 y.o. female.  HPI 21 year old female G1, P0 LMP July 2090, positive pregnancy test end of August, has not seen OB yet.  She has a appointment with Kendell Bane.  She presents today stating that she has had some spotting over the past 24 hours and some intermittent crampy lower abdominal discomfort that she feels like is correlated with when she is hungry.  She reports a past history of GC and chlamydia a year ago and states she was treated.  She has not noted any new abnormal vaginal discharge or reports from her partner of STIs.  She has not been lightheaded, febrile, or had severe pain.  She is not having any burning with urination but does report ongoing frequency of urination     Home Medications Prior to Admission medications   Medication Sig Start Date End Date Taking? Authorizing Provider  albuterol (PROVENTIL) (2.5 MG/3ML) 0.083% nebulizer solution Take 3 mLs (2.5 mg total) by nebulization every 6 (six) hours as needed for wheezing or shortness of breath. Patient not taking: Reported on 01/30/2021 08/22/20   Valinda Hoar, NP  albuterol (PROVENTIL) (2.5 MG/3ML) 0.083% nebulizer solution Take 3 mLs (2.5 mg total) by nebulization every 6 (six) hours as needed for wheezing or shortness of breath. 02/21/21   Terald Sleeper, MD  albuterol (VENTOLIN HFA) 108 (90 Base) MCG/ACT inhaler Inhale 1-2 puffs into the lungs every 6 (six) hours as needed for wheezing or shortness of breath. 04/15/21   Haskel Schroeder, PA-C  benzonatate (TESSALON) 100 MG capsule Take 1-2 capsules (100-200 mg total) by mouth 3 (three) times daily as needed for cough. 12/05/20   Wallis Bamberg, PA-C  Cetirizine HCl 10 MG CAPS Take 1 capsule (10 mg total) by mouth daily for 10 days. Patient not taking:  Reported on 01/30/2021 08/22/20 10/24/20  Valinda Hoar, NP  etonogestrel (NEXPLANON) 68 MG IMPL implant 1 each by Subdermal route once.    [provider]  fluticasone (FLONASE) 50 MCG/ACT nasal spray Place 1-2 sprays into both nostrils daily. Patient not taking: Reported on 01/30/2021 06/11/20   Wieters, Hallie C, PA-C  montelukast (SINGULAIR) 10 MG tablet Take 1 tablet (10 mg total) by mouth at bedtime. Patient not taking: Reported on 01/30/2021 12/05/20   Wallis Bamberg, PA-C  Nebulizer System All-In-One MISC 1 application by Does not apply route every 4 (four) hours as needed. Use with nebulized albuterol. 08/22/20   Valinda Hoar, NP  promethazine-dextromethorphan (PROMETHAZINE-DM) 6.25-15 MG/5ML syrup Take 5 mLs by mouth at bedtime as needed for cough. 12/05/20   Wallis Bamberg, PA-C  loratadine (CLARITIN) 10 MG tablet Take 1 tablet (10 mg total) by mouth daily. Patient not taking: No sig reported 12/07/11 06/11/20  Johnsie Kindred, NP      Allergies    Patient has no known allergies.    Review of Systems   Review of Systems  Physical Exam Updated Vital Signs BP (!) 108/97 (BP Location: Right Arm)   Pulse 72   Temp 97.8 F (36.6 C) (Oral)   Resp 18   Ht 1.626 m (5\' 4" )   Wt 81 kg   LMP 10/24/2021 Comment: 6 weeks  SpO2 100%   BMI 30.65 kg/m  Physical Exam Vitals and nursing  note reviewed. Exam conducted with a chaperone present.  Constitutional:      General: She is not in acute distress.    Appearance: Normal appearance. She is not ill-appearing.  HENT:     Head: Normocephalic and atraumatic.     Right Ear: External ear normal.     Left Ear: External ear normal.     Nose: Nose normal.     Mouth/Throat:     Pharynx: Oropharynx is clear.  Eyes:     Extraocular Movements: Extraocular movements intact.     Pupils: Pupils are equal, round, and reactive to light.  Cardiovascular:     Rate and Rhythm: Normal rate and regular rhythm.     Pulses: Normal pulses.   Pulmonary:     Effort: Pulmonary effort is normal.     Breath sounds: Normal breath sounds.  Abdominal:     General: Abdomen is flat.     Palpations: Abdomen is soft.     Comments: Mild tenderness to palpation suprapubic and bilateral lower quadrants  Genitourinary:    General: Normal vulva.     Exam position: Lithotomy position.     Pubic Area: No rash.      Vagina: Normal.     Cervix: Cervical bleeding present.     Uterus: Enlarged. Not tender.      Adnexa: Right adnexa normal and left adnexa normal.     Comments: Scant blood noted in vaginal area no overt bleeding from eyes Musculoskeletal:     Cervical back: Normal range of motion.  Neurological:     Mental Status: She is alert.     ED Results / Procedures / Treatments   Labs (all labs ordered are listed, but only abnormal results are displayed) Labs Reviewed  CBC - Abnormal; Notable for the following components:      Result Value   WBC 11.5 (*)    All other components within normal limits  BASIC METABOLIC PANEL - Abnormal; Notable for the following components:   Glucose, Bld 124 (*)    All other components within normal limits  HCG, QUANTITATIVE, PREGNANCY - Abnormal; Notable for the following components:   hCG, Beta Chain, Quant, S 3,740 (*)    All other components within normal limits  URINALYSIS, ROUTINE W REFLEX MICROSCOPIC - Abnormal; Notable for the following components:   Hgb urine dipstick MODERATE (*)    Bacteria, UA RARE (*)    All other components within normal limits  WET PREP, GENITAL  GC/CHLAMYDIA PROBE AMP () NOT AT [facility]    EKG None  Radiology US OB Comp < 14 Wks  Result Date: 12/09/2021 CLINICAL DATA:  Vaginal bleeding. Estimated gestational age of [redacted] weeks, 4 days by LMP. EXAM: OBSTETRIC <14 WK [place] AND TRANSVAGINAL OB US TECHNIQUE: Both transabdominal and transvaginal ultrasound examinations were performed for complete evaluation of the gestation as well as the maternal uterus,  adnexal regions, and pelvic cul-de-sac. Transvaginal technique was performed to assess early pregnancy. COMPARISON:  None Available. FINDINGS: Intrauterine gestational sac: Single Yolk sac:  Not Visualized. Embryo:  Not visualized. MSD: 7.5 mm   5 w   2  d Subchorionic hemorrhage:  None visualized. Maternal uterus/adnexae: Unremarkable. IMPRESSION: 1. Probable early intrauterine gestational sac, but no yolk sac, fetal pole, or cardiac activity yet visualized. The estimated gestational age by ultrasound is earlier than by LMP. Recommend follow-up quantitative B-HCG levels and follow-up US in 14 days to assess viability. This recommendation follows SRU consensus guidelines: Diagnostic  Criteria for Nonviable Pregnancy Early in the First Trimester. Malva Limes Med 2013; 809:9833-82. Electronically Signed   By: Obie Dredge M.D.   On: 12/09/2021 11:03   US OB Transvaginal  Result Date: 12/09/2021 CLINICAL DATA:  Vaginal bleeding. Estimated gestational age of [redacted] weeks, 4 days by LMP. EXAM: OBSTETRIC <14 WK [place] AND TRANSVAGINAL OB US TECHNIQUE: Both transabdominal and transvaginal ultrasound examinations were performed for complete evaluation of the gestation as well as the maternal uterus, adnexal regions, and pelvic cul-de-sac. Transvaginal technique was performed to assess early pregnancy. COMPARISON:  None Available. FINDINGS: Intrauterine gestational sac: Single Yolk sac:  Not Visualized. Embryo:  Not visualized. MSD: 7.5 mm   5 w   2  d Subchorionic hemorrhage:  None visualized. Maternal uterus/adnexae: Unremarkable. IMPRESSION: 1. Probable early intrauterine gestational sac, but no yolk sac, fetal pole, or cardiac activity yet visualized. The estimated gestational age by ultrasound is earlier than by LMP. Recommend follow-up quantitative B-HCG levels and follow-up US in 14 days to assess viability. This recommendation follows SRU consensus guidelines: Diagnostic Criteria for Nonviable Pregnancy Early in the First  Trimester. Malva Limes Med 2013; 505:3976-73. Electronically Signed   By: Obie Dredge M.D.   On: 12/09/2021 11:03    Procedures Procedures    Medications Ordered in ED Medications - No data to display  ED Course/ Medical Decision Making/ A&P Clinical Course as of 12/09/21 1136  Tue Dec 09, 2021  1106 Beta hCG obtained and 3740 consistent with [redacted] weeks gestation [DR]  1107 HCG, Beta Chain, Quant, S(!): 3,740 [DR]  1107 HCG, Beta Chain, Quant, S(!): 3,740 [DR]  1107 Basic metabolic panel reviewed interpreted and mild hyperglycemia with glucose of 124 otherwise within normal limits [DR]  1107 Urinalysis reviewed and significant for moderate hemoglobin with 11-20 red blood cells rare bacteria and 0-5 white blood cells.  This is consistent with her vaginal bleeding do not think that it represents an infection [DR]  1108 Ultrasound reviewed and interpreted with probable intrauterine gestational sac but no yolk sac, fetal pole, or cardiac activity visualized the estimated gestational age by ultrasound is earlier than by LMP [DR]    Clinical Course User Index [DR] Margarita Grizzle, MD                           Medical Decision Making 21 year old female G1, P0 LAM P729 presents today with some cramping and spotting. Patient evaluated here in the ED with labs Differential diagnosis includes but is not limited to ectopic pregnancy Threatened AB Cervicitis Trauma Placenta previa  Reviewed and patient has stational sac but no yolk sac or fetal pole which could represent earlier in pregnancy than dates or lack of fetal development Patient is remained hemodynamically stable Discussed with Dr. Despina Hidden, on-call for OB Patient is [facility] Discussed return precautions and need for follow-up and she voices understanding   Amount and/or Complexity of Data Reviewed Labs: ordered. Decision-making details documented in ED Course. Radiology: ordered and independent  interpretation performed. Decision-making details documented in ED Course. Discussion of management or test interpretation with external provider(s): Discussed with Dr. Despina Hidden           Final Clinical Impression(s) / ED Diagnoses Final diagnoses:  Vaginal bleeding in pregnancy    Rx / DC Orders ED Discharge Orders     None         Margarita Grizzle, MD 12/09/21 1136

## 2021-12-09 NOTE — ED Provider Triage Note (Signed)
Emergency Medicine Provider Triage Evaluation Note  Patricia [name] , a 21 y.o. female  was evaluated in triage.  Pt complains of abdominal cramping, vaginal bleeding.  Patient reports that around August 28 she realize she was pregnant.  Patient reports that she has neonatal care appointment with "[facility]" on [address] sometime this week.  Patient reports that this is her first pregnancy.  Patient states that yesterday around 4 PM she started to have light pink vaginal spotting which is progressively darkened and is now red vaginal spotting.  The patient believes she passed 1 clot.  The patient is endorsing abdominal cramping yesterday however states that she "could have been hungry".  Patient reports that after eating the abdominal cramping subsided.  On examination the patient is endorsing abdominal cramping however again states that it could be because of her "hunger".  Patient denies any nausea, vomiting, fevers, vaginal discharge.  Patient does endorse burning with urination.  Review of Systems  Positive:  Negative:   Physical Exam  BP 137/72 (BP Location: Right Arm)   Pulse 83   Temp 98.3 F (36.8 C) (Oral)   Resp 18   Ht 5\' 4"  (1.626 m)   Wt 81 kg   LMP 10/24/2021 Comment: 6 weeks  SpO2 100%   BMI 30.65 kg/m  Gen:   Awake, no distress   Resp:  Normal effort  MSK:   Moves extremities without difficulty  Other:    Medical Decision Making  Medically screening exam initiated at 7:32 AM.  Appropriate orders placed.  Patricia [name] was informed that the remainder of the evaluation will be completed by another provider, this initial triage assessment does not replace that evaluation, and the importance of remaining in the ED until their evaluation is complete.     Vernona Rieger, PA-C 12/09/21 [phone]

## 2021-12-09 NOTE — Progress Notes (Signed)
U/s ordered for follow up for vaginal bleeding.

## 2021-12-11 ENCOUNTER — Telehealth: Payer: Self-pay

## 2021-12-11 ENCOUNTER — Inpatient Hospital Stay (HOSPITAL_COMMUNITY)
Admission: AD | Admit: 2021-12-11 | Discharge: 2021-12-11 | Disposition: A | Discharge status: Home / Self Care | Payer: 59 | Attending: Obstetrics & Gynecology | Admitting: Obstetrics & Gynecology

## 2021-12-11 ENCOUNTER — Inpatient Hospital Stay (HOSPITAL_COMMUNITY): Payer: 59

## 2021-12-11 ENCOUNTER — Encounter (HOSPITAL_COMMUNITY): Payer: Self-pay | Admitting: Obstetrics & Gynecology

## 2021-12-11 DIAGNOSIS — Z3A01 Less than 8 weeks gestation of pregnancy: Secondary | ICD-10-CM | POA: Diagnosis not present

## 2021-12-11 DIAGNOSIS — O039 Complete or unspecified spontaneous abortion without complication: Secondary | ICD-10-CM

## 2021-12-11 LAB — CBC
HCT: 38.8 % (ref 36.0–46.0)
Hemoglobin: 13.1 g/dL (ref 12.0–15.0)
MCH: 29.5 pg (ref 26.0–34.0)
MCHC: 33.8 g/dL (ref 30.0–36.0)
MCV: 87.4 fL (ref 80.0–100.0)
Platelets: 238 10*3/uL (ref 150–400)
RBC: 4.44 MIL/uL (ref 3.87–5.11)
RDW: 13.3 % (ref 11.5–15.5)
WBC: 12.7 10*3/uL — ABNORMAL HIGH (ref 4.0–10.5)
nRBC: 0 % (ref 0.0–0.2)

## 2021-12-11 LAB — URINALYSIS, ROUTINE W REFLEX MICROSCOPIC
Bilirubin Urine: NEGATIVE
Glucose, UA: NEGATIVE mg/dL
Ketones, ur: 20 mg/dL — AB
Leukocytes,Ua: NEGATIVE
Nitrite: NEGATIVE
Protein, ur: 30 mg/dL — AB
RBC / HPF: >50 RBC/hpf — ABNORMAL HIGH (ref 0–5)
Specific Gravity, Urine: 1.03 (ref 1.005–1.030)
pH: 5 (ref 5.0–8.0)

## 2021-12-11 LAB — HCG, QUANTITATIVE, PREGNANCY: hCG, Beta Chain, Quant, S: 1716 m[IU]/mL — ABNORMAL HIGH (ref <5)

## 2021-12-11 LAB — GC/CHLAMYDIA PROBE AMP (~~LOC~~) NOT AT ARMC
Comment: NEGATIVE
Comment: NORMAL

## 2021-12-11 LAB — ABO/RH: ABO/RH(D): B POS

## 2021-12-11 MED ORDER — PROMETHAZINE HCL 25 MG PO TABS: 25.0000 mg | ORAL_TABLET | Freq: Four times a day (QID) | ORAL | 0 refills | Status: DC | PRN

## 2021-12-11 MED ORDER — OXYCODONE HCL 5 MG PO TABS: 5.0000 mg | ORAL_TABLET | Freq: Four times a day (QID) | ORAL | 0 refills | Status: AC | PRN

## 2021-12-11 MED ORDER — MISOPROSTOL 200 MCG PO TABS: 800.0000 ug | ORAL_TABLET | Freq: Once | ORAL | 0 refills | Status: DC

## 2021-12-11 NOTE — MAU Provider Note (Signed)
History     [id number]  Arrival date and time: 12/11/21 1702    Chief Complaint  Patient presents with   Vaginal Bleeding     HPI Patricia [name] is a 21 y.o. at [redacted]w[redacted]d who presents for vaginal bleeding. Reports increase in vaginal bleeding since being seen at [facility] earlier this week. States she is bleeding into a pad but not saturating them. Also has passed some blood clots. Also reports lower abdominal cramping intermittently all week. Denies fever, dysuria, vaginal discharge.   OB History     Gravida  1   Para      Term      Preterm      AB      Living         SAB      IAB      Ectopic      Multiple      Live Births              Past Medical History:  Diagnosis Date   Asthma     Past Surgical History:  Procedure Laterality Date   TONSILLECTOMY     TYMPANOSTOMY TUBE PLACEMENT      Family History  Problem Relation Age of Onset   Kidney Stones Mother    Asthma Father     No Known Allergies  No current facility-administered medications on file prior to encounter.   Current Outpatient Medications on File Prior to Encounter  Medication Sig Dispense Refill   albuterol (VENTOLIN HFA) 108 (90 Base) MCG/ACT inhaler Inhale 1-2 puffs into the lungs every 6 (six) hours as needed for wheezing or shortness of breath. 18 g 0   albuterol (PROVENTIL) (2.5 MG/3ML) 0.083% nebulizer solution Take 3 mLs (2.5 mg total) by nebulization every 6 (six) hours as needed for wheezing or shortness of breath. 75 mL 12   benzonatate (TESSALON) 100 MG capsule Take 1-2 capsules (100-200 mg total) by mouth 3 (three) times daily as needed for cough. 60 capsule 0   Nebulizer System All-In-One MISC 1 application by Does not apply route every 4 (four) hours as needed. Use with nebulized albuterol. 1 each 0   [DISCONTINUED] loratadine (CLARITIN) 10 MG tablet Take 1 tablet (10 mg total) by mouth daily. (Patient not taking: No sig reported) 30 tablet 1     ROS Pertinent positives  and negative per HPI, all others reviewed and negative  Physical Exam   BP (!) 136/59   Pulse 80   Temp 98.5 F (36.9 C)   Resp 15   Ht 5\' 4"  (1.626 m)   Wt 88 kg   LMP 10/24/2021 Comment: 6 weeks  SpO2 98%   BMI 33.30 kg/m   Patient Vitals for the past 24 hrs:  BP Temp Pulse Resp SpO2 Height Weight  12/11/21 2014 -- -- -- 15 -- -- --  12/11/21 2010 -- -- -- -- 98 % -- --  12/11/21 2009 (!) 136/59 -- 80 -- -- -- --  12/11/21 1726 122/66 98.5 F (36.9 C) 78 18 -- 5\' 4"  (1.626 m) 88 kg    Physical Exam Vitals and nursing note reviewed.  Constitutional:      General: She is not in acute distress.    Appearance: Normal appearance.  HENT:     Head: Normocephalic and atraumatic.  Eyes:     General: No scleral icterus.    Conjunctiva/sclera: Conjunctivae normal.  Pulmonary:     Effort: Pulmonary effort is normal. No respiratory  distress.  Abdominal:     General: Abdomen is flat.     Palpations: Abdomen is soft.  Skin:    General: Skin is warm and dry.  Neurological:     Mental Status: She is alert.  Psychiatric:        Mood and Affect: Mood normal.        Behavior: Behavior normal.       Labs Results for orders placed or performed during the hospital encounter of 12/11/21 (from the past 24 hour(s))  Urinalysis, Routine w reflex microscopic Urine, Clean Catch     Status: Abnormal   Collection Time: 12/11/21  5:34 PM  Result Value Ref Range   Color, Urine YELLOW YELLOW   APPearance HAZY (A) CLEAR   Specific Gravity, Urine 1.030 1.005 - 1.030   pH 5.0 5.0 - 8.0   Glucose, UA NEGATIVE NEGATIVE mg/dL   Hgb urine dipstick LARGE (A) NEGATIVE   Bilirubin Urine NEGATIVE NEGATIVE   Ketones, ur 20 (A) NEGATIVE mg/dL   Protein, ur 30 (A) NEGATIVE mg/dL   Nitrite NEGATIVE NEGATIVE   Leukocytes,Ua NEGATIVE NEGATIVE   RBC / HPF >50 (H) 0 - 5 RBC/hpf   WBC, UA 0-5 0 - 5 WBC/hpf   Bacteria, UA RARE (A) NONE SEEN   Squamous Epithelial / LPF 0-5 0 - 5   Mucus PRESENT    CBC     Status: Abnormal   Collection Time: 12/11/21  6:14 PM  Result Value Ref Range   WBC 12.7 (H) 4.0 - 10.5 K/uL   RBC 4.44 3.87 - 5.11 MIL/uL   Hemoglobin 13.1 12.0 - 15.0 g/dL   HCT 09.9 83.3 - 82.5 %   MCV 87.4 80.0 - 100.0 fL   MCH 29.5 26.0 - 34.0 pg   MCHC 33.8 30.0 - 36.0 g/dL   RDW 05.3 97.6 - 73.4 %   Platelets 238 150 - 400 K/uL   nRBC 0.0 0.0 - 0.2 %  hCG, quantitative, pregnancy     Status: Abnormal   Collection Time: 12/11/21  6:14 PM  Result Value Ref Range   hCG, Beta Chain, Quant, S 1,716 (H) <5 mIU/mL  ABO/Rh     Status: None   Collection Time: 12/11/21  6:14 PM  Result Value Ref Range   ABO/RH(D) B POS    No rh immune globuloin      NOT A RH IMMUNE GLOBULIN CANDIDATE, PT RH POSITIVE Performed at [facility], [address], [place], [place] [address]     Imaging US OB Transvaginal  Result Date: 12/11/2021 CLINICAL DATA:  Vaginal bleeding, pregnant, LMP 10/24/2021 EXAM: TRANSVAGINAL OB ULTRASOUND TECHNIQUE: Transvaginal ultrasound was performed for complete evaluation of the gestation as well as the maternal uterus, adnexal regions, and pelvic cul-de-sac. COMPARISON:  None Available. FINDINGS: Intrauterine gestational sac: Present, single, located at lower uterine segment Yolk sac:  Not identified Embryo:  Not identified Cardiac Activity: N/A Heart Rate: N/A bpm MSD: 9.1 mm   5 w   5 d Subchorionic hemorrhage:  None visualized. Maternal uterus/adnexae: LEFT ovary normal size and morphology, 3.3 x 1.9 x 2.0 cm. RIGHT ovary normal size and morphology, 2.7 x 1.5 x 1.5 cm. Remainder of maternal uterus unremarkable. No free pelvic fluid or adnexal masses. IMPRESSION: Gestational sac within the uterus, mean sac diameter corresponding to 5 weeks 5 days EGA. No fetal pole identified to establish viability. Visualized gestational sac was is within the lower uterine segment endometrial complex. Electronically Signed  By: Ulyses Southward M.D.   On: 12/11/2021 18:56     MAU Course  Procedures Lab Orders         Urinalysis, Routine w reflex microscopic Urine, Clean Catch         CBC         hCG, quantitative, pregnancy     Meds ordered this encounter  Medications   misoprostol (CYTOTEC) 200 MCG tablet    Sig: Take 4 tablets (800 mcg total) by mouth once for 1 dose.    Dispense:  4 tablet    Refill:  0    Order Specific Question:   Supervising Provider    Answer:   Venora Maples [[id number]]   promethazine (PHENERGAN) 25 MG tablet    Sig: Take 1 tablet (25 mg total) by mouth every 6 (six) hours as needed for nausea or vomiting.    Dispense:  30 tablet    Refill:  0    Order Specific Question:   Supervising Provider    Answer:   Venora Maples [[id number]]   oxyCODONE (ROXICODONE) 5 MG immediate release tablet    Sig: Take 1 tablet (5 mg total) by mouth every 6 (six) hours as needed for up to 3 days for breakthrough pain.    Dispense:  12 tablet    Refill:  0    Order Specific Question:   Supervising Provider    Answer:   Venora Maples [[id number]]   Imaging Orders         US OB Transvaginal     MDM Reviewed note & results from previous visit. Patient had IUGS per ultrasound on Tuesday & HCG of 3740. Was supposed to have f/u viability scan in a few weeks.  Wet prep & GC/CT were negative. RH positive.   Today, her HCG has dropped to 1716 & ultrasound shows IUGS in lower uterine segment.  Ultrasound images from Tuesday & today reviewed by Dr. Crissie Reese. Change in ultrasound & significant decrease in HCG give diagnosis of miscarriage.   Discussed management of missed abortion: expectant management vs misoprostol vs D&E.  Risks and benefits of all modalities discussed; all questions answered.  Patient opted for misoprostol administration.  Risks and benefits of medical management were carefully explained, including a success rate of 80-90%, the need for another person to be at home with her, and to call/come in if she had heavy bleeding,  dizziness, or severe pain not relieved by medication.  Verbal consent was obtained.  Misoprostol 800 mcg & oxycodone were prescribed; written instructions given to patient. She will follow up in one week; if there has been no passage of pregnancy, will consider D&E.  Bleeding precautions reviewed; she was told to call clinic or come to MAU for any concerns.  Assessment and Plan   1. Miscarriage   2. [redacted] weeks gestation of pregnancy    -Rx cytotec, phenergan, & oxycodone -Reviewed bleeding precautions & reasons to return to MAU -pelvic rest -Message sent to office for f/u appointment in 1 week   Judeth Horn, NP 12/11/21 9:12 PM

## 2021-12-11 NOTE — Discharge Instructions (Signed)
Return to care  If you have heavier bleeding that soaks through more than 2 pads per hour for an hour or more If you bleed so much that you feel like you might pass out or you do pass out If you have significant abdominal pain that is not improved with Tylenol   Cytotec for Pregnancy Failure FACTS YOU SHOULD KNOW  WHAT IS AN EARLY PREGNANCY FAILURE? Once the egg is fertilized with the sperm and begins to develop, it attaches to the lining of the uterus. This early pregnancy tissue may not develop into an embryo (the beginning stage of a baby). Sometimes an embryo does develop but does not continue to grow. These problems can be seen on ultrasound.   MANAGEMNT OF EARLY PREGNANCY FAILURE: About 4 out of 100 (0.25%) women will have a pregnancy loss in her lifetime.  One in five pregnancies is found to be an early pregnancy failure.  There are 3 ways to care for an early pregnancy failure:   Surgery, (2) Medicine, (3) Waiting for you to pass the pregnancy on your own. The decision as to how to proceed after being diagnosed with and early pregnancy failure is an individual one.  The decision can be made only after appropriate counseling.  You need to weigh the pros and cons of the 3 choices. Then you can make the choice that works for you. SURGERY (D&E) Procedure over in 1 day Requires being put to sleep Bleeding may be light Possible problems during surgery, including injury to womb(uterus) Care provider has more control Medicine (CYTOTEC) The complete procedure may take days to weeks No Surgery Bleeding may be heavy at times There may be drug side effects Patient has more control Waiting You may choose to wait, in which case your own body may complete the passing of the abnormal early pregnancy on its own in about 2-4 weeks Your bleeding may be heavy at times There is a small possibility that you may need surgery if the bleeding is too much or not all of the pregnancy has passed. CYTOTEC  MANAGEMENT Prostaglandins (cytotec) are the most widely used drug for this purpose. They cause the uterus to cramp and contract. You will place the medicine yourself inside your vagina in the privacy of your home. Empting of the uterus should occur within 3 days but the process may continue for several weeks. The bleeding may seem heavy at times. POSSIBLE SIDE EFFECTS FROM CYTOTEC Nausea   Vomiting Diarrhea Fever Chills  Hot Flashes Side effects  from the process of the early pregnancy failure include: Cramping  Bleeding Headaches  Dizziness RISKS: This is a low risk procedure. Less than 1 in 100 women has a complication. An incomplete passage of the early pregnancy may occur. Also, Hemorrhage (heavy bleeding) could happen.  Rarely the pregnancy will not be passed completely. Excessively heavy bleeding may occur.  Your doctor may need to perform surgery to empty the uterus (D&E). Afterwards: Everybody will feel differently after the early pregnancy completion. You may have soreness or cramps for a day or two. You may have soreness or cramps for day or two.  You may have light bleeding for up to 2 weeks. You may be as active as you feel like being. If you have any of the following problems you may call Maternity Admissions Unit at [phone]. If you have pain that does not get better  with pain medication Bleeding that soaks through 2 thick full-sized sanitary pads in an hour  Cramps that last longer than 2 days Foul smelling discharge Fever above 100.4 degrees F Even if you do not have any of these symptoms, you should have a follow-up exam to make sure you are healing properly. This appointment will be made for you before you leave the hospital. Your next normal period will start again in 4-6 week after the loss. You can get pregnant soon after the loss, so use birth control right away. Finally: Make sure all your questions are answered before during and after any procedure. Follow up with  medical care and family planning methods.

## 2021-12-11 NOTE — Telephone Encounter (Signed)
Returned patient call regarding her having some questions Patient states that she is now starting to have increased bleeding to where she is having to where a pad. Patient denies having any pain, but state that she feels drained. Patient advised to go to MAU for evaluation. Patient verbalized understanding.

## 2021-12-11 NOTE — MAU Note (Signed)
.  Patricia [name] is a 21 y.o. at [redacted]w[redacted]d here in MAU reporting: has been having vaginal bleeding x 2-3 days.c/o abd cramping  on and off as well.  Stated she thinks she is having a miscarriage. Her Morning sickness is gone . LMP: 10/24/21 Onset of complaint: 2-3 days Pain score: 6 Vitals:   12/11/21 1726  BP: 122/66  Pulse: 78  Resp: 18  Temp: 98.5 F (36.9 C)     FHT:n/a Lab orders placed from triage:  u/a

## 2021-12-12 ENCOUNTER — Telehealth (HOSPITAL_COMMUNITY): Payer: Self-pay | Admitting: *Deleted

## 2021-12-18 ENCOUNTER — Ambulatory Visit (INDEPENDENT_AMBULATORY_CARE_PROVIDER_SITE_OTHER): Payer: 59

## 2021-12-18 VITALS — BP 126/78 | HR 76 | Wt 195.0 lb

## 2021-12-18 DIAGNOSIS — Z09 Encounter for follow-up examination after completed treatment for conditions other than malignant neoplasm: Secondary | ICD-10-CM | POA: Diagnosis not present

## 2021-12-18 DIAGNOSIS — O039 Complete or unspecified spontaneous abortion without complication: Secondary | ICD-10-CM | POA: Diagnosis not present

## 2021-12-18 NOTE — Progress Notes (Signed)
OFFICE VISIT NOTE-FOLLOW UP   History:  Patricia [name] is a 21 y.o. G1P0 here today for follow up after sAB. She was diagnosed on 12/11/2021 and reports passing large clot on 12/12/2021 in the am.  She denies any abnormal vaginal discharge, bleeding, or pelvic pain since that time. She reports having sex yesterday without pain or discomfort.   Patient does not desire pregnancy, but is unsure of what birth control she wants to start.  She states she has pills at home.   Past Medical History:  Diagnosis Date   Asthma     Past Surgical History:  Procedure Laterality Date   TONSILLECTOMY     TYMPANOSTOMY TUBE PLACEMENT      The following portions of the patient's history were reviewed and updated as appropriate: allergies, current medications, past family history, past medical history, past social history, past surgical history and problem list.   Health Maintenance:  No pap on file d/t age.    Review of Systems:  Genito-Urinary ROS: no dysuria, trouble voiding, or hematuria Gastrointestinal ROS: negative Objective:  Vitals: BP 126/78   Pulse 76   Wt 195 lb (88.5 kg)   LMP 10/24/2021 Comment: 6 weeks  BMI 33.47 kg/m   Physical Exam: Physical Exam Constitutional:      General: She is not in acute distress.    Appearance: Normal appearance.  HENT:     Head: Normocephalic and atraumatic.  Eyes:     Conjunctiva/sclera: Conjunctivae normal.  Cardiovascular:     Rate and Rhythm: Normal rate.  Pulmonary:     Effort: Pulmonary effort is normal. No respiratory distress.  Musculoskeletal:        General: Normal range of motion.     Cervical back: Normal range of motion.  Neurological:     Mental Status: She is alert and oriented to person, place, and time.  Psychiatric:        Mood and Affect: Mood normal.        Behavior: Behavior normal.  Vitals reviewed.      Labs and Imaging: US OB Transvaginal  Result Date: 12/11/2021 CLINICAL DATA:  Vaginal bleeding, pregnant,  LMP 10/24/2021 EXAM: TRANSVAGINAL OB ULTRASOUND TECHNIQUE: Transvaginal ultrasound was performed for complete evaluation of the gestation as well as the maternal uterus, adnexal regions, and pelvic cul-de-sac. COMPARISON:  None Available. FINDINGS: Intrauterine gestational sac: Present, single, located at lower uterine segment Yolk sac:  Not identified Embryo:  Not identified Cardiac Activity: N/A Heart Rate: N/A bpm MSD: 9.1 mm   5 w   5 d Subchorionic hemorrhage:  None visualized. Maternal uterus/adnexae: LEFT ovary normal size and morphology, 3.3 x 1.9 x 2.0 cm. RIGHT ovary normal size and morphology, 2.7 x 1.5 x 1.5 cm. Remainder of maternal uterus unremarkable. No free pelvic fluid or adnexal masses. IMPRESSION: Gestational sac within the uterus, mean sac diameter corresponding to 5 weeks 5 days EGA. No fetal pole identified to establish viability. Visualized gestational sac was is within the lower uterine segment endometrial complex. Electronically Signed   By: Ulyses Southward M.D.   On: 12/11/2021 18:56   US OB Comp < 14 Wks  Result Date: 12/09/2021 CLINICAL DATA:  Vaginal bleeding. Estimated gestational age of [redacted] weeks, 4 days by LMP. EXAM: OBSTETRIC <14 WK [place] AND TRANSVAGINAL OB US TECHNIQUE: Both transabdominal and transvaginal ultrasound examinations were performed for complete evaluation of the gestation as well as the maternal uterus, adnexal regions, and pelvic cul-de-sac. Transvaginal technique was performed to  assess early pregnancy. COMPARISON:  None Available. FINDINGS: Intrauterine gestational sac: Single Yolk sac:  Not Visualized. Embryo:  Not visualized. MSD: 7.5 mm   5 w   2  d Subchorionic hemorrhage:  None visualized. Maternal uterus/adnexae: Unremarkable. IMPRESSION: 1. Probable early intrauterine gestational sac, but no yolk sac, fetal pole, or cardiac activity yet visualized. The estimated gestational age by ultrasound is earlier than by LMP. Recommend follow-up quantitative B-HCG levels  and follow-up US in 14 days to assess viability. This recommendation follows SRU consensus guidelines: Diagnostic Criteria for Nonviable Pregnancy Early in the First Trimester. [employer] Med 2013; 161:0960-45. Electronically Signed   By: Titus Dubin M.D.   On: 12/09/2021 11:03   US OB Transvaginal  Result Date: 12/09/2021 CLINICAL DATA:  Vaginal bleeding. Estimated gestational age of [redacted] weeks, 4 days by LMP. EXAM: OBSTETRIC <14 WK [place] AND TRANSVAGINAL OB US TECHNIQUE: Both transabdominal and transvaginal ultrasound examinations were performed for complete evaluation of the gestation as well as the maternal uterus, adnexal regions, and pelvic cul-de-sac. Transvaginal technique was performed to assess early pregnancy. COMPARISON:  None Available. FINDINGS: Intrauterine gestational sac: Single Yolk sac:  Not Visualized. Embryo:  Not visualized. MSD: 7.5 mm   5 w   2  d Subchorionic hemorrhage:  None visualized. Maternal uterus/adnexae: Unremarkable. IMPRESSION: 1. Probable early intrauterine gestational sac, but no yolk sac, fetal pole, or cardiac activity yet visualized. The estimated gestational age by ultrasound is earlier than by LMP. Recommend follow-up quantitative B-HCG levels and follow-up US in 14 days to assess viability. This recommendation follows SRU consensus guidelines: Diagnostic Criteria for Nonviable Pregnancy Early in the First Trimester. [employer] Med 2013; 409:8119-14. Electronically Signed   By: Titus Dubin M.D.   On: 12/09/2021 11:03    Assessment & Plan:  21 year old SAB  -Discussed completing hCG today to evaluate pregnancy hormone level. -Encouraged abstinence or sex with condom usage until labs return to pre-pregnancy state. -Encouraged usage of PNV until able to get contraception in place in case pregnancy occurs. -Plan to return to office for repeat hCG if applicable or in 3 months for pap smear.    Total face-to-face time with patient:  10  minutes   Gavin Pound,  CNM 12/18/2021 9:05 AM

## 2021-12-18 NOTE — Progress Notes (Signed)
Pt presents for SAB follow up She is no longer bleeding. No BC at this time per pt

## 2021-12-20 LAB — BETA HCG QUANT (REF LAB): hCG Quant: 46 m[IU]/mL

## 2021-12-22 ENCOUNTER — Telehealth: Payer: Self-pay

## 2021-12-22 NOTE — Telephone Encounter (Signed)
Pt requested I call her (see My Chart message), pt wants to know the next step and if she can have unprotected intercourse. Informed pt next step is repeat HCG which is scheduled for 12/24/2021 and per Gavin Pound, CNM note pt should not be having unprotected intercourse at this time until labs return to pre-pregnancy state.   Pt has no further questions at this time.

## 2021-12-24 ENCOUNTER — Other Ambulatory Visit: Payer: 59

## 2021-12-24 ENCOUNTER — Other Ambulatory Visit: Payer: Self-pay | Admitting: *Deleted

## 2021-12-24 DIAGNOSIS — O039 Complete or unspecified spontaneous abortion without complication: Secondary | ICD-10-CM

## 2021-12-25 ENCOUNTER — Ambulatory Visit: Payer: 59

## 2021-12-25 ENCOUNTER — Other Ambulatory Visit: Payer: 59

## 2021-12-25 LAB — BETA HCG QUANT (REF LAB): hCG Quant: 9 m[IU]/mL

## 2022-01-07 ENCOUNTER — Other Ambulatory Visit: Payer: Self-pay | Admitting: *Deleted

## 2022-01-07 ENCOUNTER — Other Ambulatory Visit: Payer: 59

## 2022-01-07 DIAGNOSIS — O039 Complete or unspecified spontaneous abortion without complication: Secondary | ICD-10-CM

## 2022-01-08 LAB — BETA HCG QUANT (REF LAB): hCG Quant: <1 m[IU]/mL

## 2022-01-15 ENCOUNTER — Encounter: Payer: 59 | Admitting: Obstetrics and Gynecology

## 2022-01-21 DIAGNOSIS — N760 Acute vaginitis: Secondary | ICD-10-CM | POA: Diagnosis not present

## 2022-01-21 DIAGNOSIS — B9689 Other specified bacterial agents as the cause of diseases classified elsewhere: Secondary | ICD-10-CM | POA: Diagnosis not present

## 2022-03-20 ENCOUNTER — Ambulatory Visit (INDEPENDENT_AMBULATORY_CARE_PROVIDER_SITE_OTHER): Payer: 59 | Admitting: Advanced Practice Midwife

## 2022-03-20 ENCOUNTER — Ambulatory Visit (INDEPENDENT_AMBULATORY_CARE_PROVIDER_SITE_OTHER): Payer: 59 | Admitting: Licensed Clinical Social Worker

## 2022-03-20 ENCOUNTER — Other Ambulatory Visit: Payer: Self-pay | Admitting: *Deleted

## 2022-03-20 ENCOUNTER — Encounter: Payer: Self-pay | Admitting: Advanced Practice Midwife

## 2022-03-20 VITALS — BP 126/86 | HR 76 | Ht 63.0 in | Wt 197.6 lb

## 2022-03-20 DIAGNOSIS — F4321 Adjustment disorder with depressed mood: Secondary | ICD-10-CM

## 2022-03-20 DIAGNOSIS — G43009 Migraine without aura, not intractable, without status migrainosus: Secondary | ICD-10-CM | POA: Diagnosis not present

## 2022-03-20 DIAGNOSIS — Z3009 Encounter for other general counseling and advice on contraception: Secondary | ICD-10-CM | POA: Diagnosis not present

## 2022-03-20 DIAGNOSIS — O039 Complete or unspecified spontaneous abortion without complication: Secondary | ICD-10-CM

## 2022-03-20 DIAGNOSIS — Z315 Encounter for genetic counseling: Secondary | ICD-10-CM

## 2022-03-20 HISTORY — DX: Complete or unspecified spontaneous abortion without complication: O03.9

## 2022-03-20 LAB — POCT URINE PREGNANCY: Preg Test, Ur: NEGATIVE

## 2022-03-20 MED ORDER — CYCLOBENZAPRINE HCL 5 MG PO TABS: 5.0000 mg | ORAL_TABLET | Freq: Every evening | ORAL | 0 refills | Status: DC | PRN

## 2022-03-20 MED ORDER — PHEXXI 1.8-1-0.4 % VA GEL: 5.0000 g | VAGINAL | 15 refills | Status: DC | PRN

## 2022-03-20 NOTE — Progress Notes (Signed)
Subjective:     Patricia [name] is a 21 y.o. female here for a follow up visit after recent miscarriage.  She was seen in [facility] on 12/09/21 and had hcg of 3,740 and [place] with gestational sac only. She returned to [facility] on 12/11/21 with vaginal bleeding and had hcg of 1,716 and US showing gestational sac in lower uterine segment.  Pt was diagnosed with SAB in process and prescribed Cytotec.  She took Cytotec, had episode of bleeding which resolved, and hcg levels were followed weekly down to <1.  She presents today for discussion of pregnancy planning or prevention.  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 These things make her worry that pregnancy loss could happen again or that she could have a baby with genetic abnormalities.  She denies any pain or bleeding today and reports her periods returned and are normal      Gynecologic/OB History Patient's last menstrual period was 03/13/2022. Contraception: coitus interruptus Last Pap: none.    Obstetric History OB History  Gravida Para Term Preterm AB Living  1       1    SAB IAB Ectopic Multiple Live Births  1            # Outcome Date GA Lbr Len/2nd Weight Sex Delivery Anes PTL Lv  1 SAB 11/2021     SAB        The following portions of the patient's history were reviewed and updated as appropriate: allergies, current medications, past family history, past medical history, past social history, past surgical history, and problem list.  Review of Systems Pertinent items noted in HPI and remainder of comprehensive ROS otherwise negative.    Objective:   BP 126/86   Pulse 76   Ht 5\' 3"  (1.6 m)   Wt 197 lb 9.6 oz (89.6 kg)   LMP 03/13/2022 Comment: 6 weeks  Breastfeeding No   BMI 35.00 kg/m   VS reviewed, nursing note reviewed,  Constitutional: well developed, well nourished, no distress HEENT: normocephalic CV:  normal rate Pulm/chest wall: normal effort Abdomen: soft Neuro: alert and oriented x 3 Skin: warm, dry Psych: affect normal   Assessment/Plan:   1. Grief --Grief r/t pregnancy loss - Ambulatory referral to Integrated Behavioral Health  2. SAB (spontaneous abortion) - Ambulatory referral to Integrated Behavioral Health - POCT urine pregnancy--UPT negative  3. Migraine without aura and without status migrainosus, not intractable --Pt with hx migraines, not evaluated recently, worse when on Nexplanon so does not want Nexplanon again.  - Ambulatory referral to Neurology - cyclobenzaprine (FLEXERIL) 5 MG tablet; Take 1-2 tablets (5-10 mg total) by mouth at bedtime as needed for muscle spasms.  Dispense: 30 tablet; Refill: 0  4. Encounter for general counseling and advice on contraceptive management --Discussed pt contraceptive plans and reviewed contraceptive methods based on pt preferences and effectiveness.  Pt prefers to avoid hormones and try Phexxi. - Lactic Ac-Citric Ac-Pot Bitart (PHEXXI) 1.8-1-0.4 % GEL; Place 5 g vaginally as needed.  Dispense: 60 g; Refill: 15  5. Encounter for procreative genetic counseling --Offered genetics counseling for pt to review her history and her partner's history to determine genetic risk and make decisions about pregnancy planning.  - [facility]B [facility]FM GENETICS REFERRAL   No follow-ups on file.   03/15/2022, CNM 4:57 PM

## 2022-03-20 NOTE — Progress Notes (Signed)
Patient presents for 3 month follow up following SAB. Patient wants to discuss contraception options. Does not want nexplanon.

## 2022-03-20 NOTE — BH Specialist Note (Signed)
Integrated Behavioral Health Initial In-Person Visit  MRN: [id number] Name: Patricia [name]  Number of Integrated Behavioral Health Clinician visits: 1 Session Start time:   10:00am Session End time: 10:37am Total time in minutes: 37 mins in [facility] at [facility]   Types of Service: Individual psychotherapy  Interpretor:No. Interpretor Name and Language: none    Warm Hand Off Completed.        Subjective: Patricia [name] is a 21 y.o. female accompanied by n/a Patient was referred by Patricia [name] for grief. Patient reports the following symptoms/concerns: depressed mood and grieving pregnancy loss Duration of problem: approx one month; Severity of problem: mild  Objective: Mood: good and Affect: Appropriate Risk of harm to self or others: No plan to harm self or others  Life Context: Family and Social: Lives in [place] School/Work: Consulting civil engineer at [employer] Self-Care: n/a Life Changes: Pregnancy loss   Patient and/or Family's Strengths/Protective Factors: Concrete supports in place (healthy food, safe environments, etc.)  Goals Addressed: Patient will: Reduce symptoms of: depression Increase knowledge and/or ability of: coping skills  Demonstrate ability to: Increase healthy adjustment to current life circumstances and Begin healthy grieving over loss  Progress towards Goals: Ongoing  Interventions: Interventions utilized: Supportive Counseling  Standardized Assessments completed: PHQ 9  Patient and/or Family Response: Patricia [name] reports grief, depressed mood and feeling uncertain regarding future plans.    Assessment: Patient currently experiencing grief .   Patient may benefit from integrated behavioral health.  Plan: Follow up with behavioral health clinician on : as needed  Behavioral recommendations: Begin healthy grieving process, practice patience with process, prioritize rest, journal writing and counteract negative thought patterns.   Referral(s): Integrated [facility] (In Clinic) "From scale of 1-10, how likely are you to follow plan?":    Gwyndolyn Saxon, LCSW

## 2022-03-25 ENCOUNTER — Other Ambulatory Visit: Payer: Self-pay | Admitting: Emergency Medicine

## 2022-03-25 DIAGNOSIS — Z3009 Encounter for other general counseling and advice on contraception: Secondary | ICD-10-CM

## 2022-03-25 MED ORDER — PHEXXI 1.8-1-0.4 % VA GEL: 5.0000 g | VAGINAL | 15 refills | Status: DC | PRN

## 2022-03-25 NOTE — Progress Notes (Signed)
Pt states pharmacy did not receive order. Resending.

## 2022-04-07 ENCOUNTER — Encounter: Payer: Self-pay | Admitting: Obstetrics and Gynecology

## 2022-04-09 ENCOUNTER — Ambulatory Visit: Payer: Self-pay | Admitting: Obstetrics

## 2022-04-09 ENCOUNTER — Other Ambulatory Visit: Payer: Self-pay

## 2022-04-09 ENCOUNTER — Ambulatory Visit: Payer: 59 | Attending: Obstetrics

## 2022-04-09 DIAGNOSIS — Z8489 Family history of other specified conditions: Secondary | ICD-10-CM

## 2022-04-14 NOTE — Progress Notes (Signed)
Referring provider: Leftwich-Kirby Length of Consultation: 50 minutes  Patricia [name] was referred for genetic counseling at [facility] at [facility] to discuss her family history of a VAMP2 gene variant. The patient was present on this virtual visit alone.    We first obtained a detailed family history and pregnancy history. The patient is not currently pregnant.  She recently experienced a 6 week miscarriage.  We reviewed that miscarriages are common, occurring in approximately 20% of recognized pregnancies.  There may be various reasons for pregnancy loss, including chromosome aneuploidies in up to 50% of first trimester losses as well as structural uterine anomalies, clotting conditions and endocrine differences.  Unless a couple has experienced recurrent losses (typically defined as 3 or more), it is unusual to find any underlying cause for the miscarriages.  After one first trimester loss, it would not be recommended to undergo a formal evaluation for miscarriages.  She reported no complications or exposure to tobacco, alcohol, recreational drugs or medications in the recent pregnancy.  Her partner, Patricia [name] is 83 years old and in good health. He has had no prior children. The patient and her partner are of African American background and are not related to each other.  In the family history, the patient stated that her maternal half brother, Patricia [name], has been diagnosed with a VAMP2 variant.  He is followed by neurology for seizures, developmental delays and autistic behaviors.  At 23 years old, he cannot walk. The patient also has an 23 year old maternal half brother with speech delay, but who is otherwise in good health with normal growth and development.  She also reported a maternal half uncle with autism spectrum disorder.  Other health concerns reported in the family include asthma in the patient, her father and paternal siblings, which we reviewed may have genetic factors. The  remainder of the family history is unremarkable for birth defects, developmental delays, recurrent pregnancy loss or known genetic conditions.  VAMP2 Gene VAMP2 Gene Related Neurodevelopmental Disorder is a rare genetic condition caused by variants in the VAMP2 gene on chromosome 17p13.1.  Features of this condition include early onset hypotonia, intellectual delays and features of autism spectrum disorder. The protein created by VAMP2 is known to be involved in the release of neurotransmitters in the central nervous system. There is some data to suggest genotype-phenotype correlation, or differences in the expression of the condition based upon the specific type of genetic change an individual may have.  Genetic variants may occur as a new, or de novo, finding in the affected family member or may be inherited from a parent.  For some conditions, the parent may show no or very mild features compared to the child who was initially diagnosed.  If this variant was inherited from Ms. Wasilewski's mother, then there is up to a 50% chance that she also has that gene change.  However, if the VAMP2 variant is de novo in her brother (or was inherited from his father since they do not share the same father), then she is not at risk to have the variant and thus her children would not be at increased risk for this condition.  In order to accurately assess the chance for Ms. Brossart or her children to have this condition, we need additional information.  We provided the patient with an email copy of a medical records release form for [place] to obtain and review medical records on the genetic testing for her brother (and/or her mother).  It is important to stress that the information we discussed at this visit is accurate only if this is the correct condition/variant that is in her brother.  If there is another cause for his condition, we would need to discuss that further.  Autism Spectrum disorder Autism Spectrum Disorder  affects approximately 1-2% of the general population in the [place], [place], and [place]. Autism is a neurological and developmental disorder that affects how people interact with others, communicate, learn, and behave. Autism is known as a "spectrum" disorder because there is wide variation in the type and severity of symptoms people experience. Genetic testing for individuals with a clinical diagnosis of Autism yields an explanation in only about 20% of cases, and the remaining 80% of cases are left with unknown etiology. In the absence of a known genetic etiology, we are unable to test directly for Autism in pregnancy. To provide a more accurate risk assessment for Ms. Oaxaca's future children, we would need to know if any specific testing has been performed on her uncle. In addition, because ASD is also part of the VAMP2 phenotype, we would want to determine any chance that this uncle could also be affected with that condition.   Routine carrier screening: We reviewed the option of carrier screening for recessive genetic conditions.  Per the ACOG Committee Opinion 691, all women who are considering a pregnancy or are currently pregnant should be offered carrier screening for, at minimum, Cystic Fibrosis (CF), Spinal Muscular Atrophy (SMA), and Hemoglobinopathies The mode of inheritance, clinical manifestations of these conditions, as well as details about testing were reviewed. A negative result on carrier screening reduces the likelihood of being a carrier, however, does not entirely rule out the possibility. If Elira was found to be a carrier for a specific condition, carrier screening for their reproductive partner would be recommended.  We discussed that expanded carrier screening (ECS) is a testing option available that evaluates for a wide range of genetic conditions. Some of these conditions are severe and actionable, but also rare; others occur more commonly, but are less severe. We discussed  that testing panels could include more than 500 autosomal recessive or X-linked genetic conditions. In the event that one partner were found to be a carrier for one or more conditions, carrier screening would be available to the other partner for those conditions. This could help determine whether the current and future pregnancies are at increased risk for a particular recessive or X-linked genetic condition. We discussed the risks, benefits, and limitations of recommended as well as expanded carrier screening.   Plan of care: Obtain and review medical records on Bryson to determine the type of variant in him as well as results of testing on their mother. We are awaiting a copy of the signed release from Ms. Khachatryan. If appropriate, we can then consider genetic testing for Ms. Choe for the familiar variant. Prior to or early in any future pregnancy, we can order carrier screening as desired by the patient.  At the time of this visit, she indicated that the ACOG recommended panel would be her choice.  We appreciate being involved in the care of this family and may be reached at [phone] with any questions or concerns.  Cherly Anderson, MS, CGC

## 2022-05-07 ENCOUNTER — Ambulatory Visit
Admission: EM | Admit: 2022-05-07 | Discharge: 2022-05-07 | Disposition: A | Discharge status: Home / Self Care | Payer: 59 | Attending: Family Medicine | Admitting: Family Medicine

## 2022-05-07 ENCOUNTER — Encounter: Payer: Self-pay | Admitting: Emergency Medicine

## 2022-05-07 ENCOUNTER — Ambulatory Visit: Payer: Self-pay

## 2022-05-07 DIAGNOSIS — L739 Follicular disorder, unspecified: Secondary | ICD-10-CM

## 2022-05-07 MED ORDER — CEPHALEXIN 500 MG PO CAPS: 500.0000 mg | ORAL_CAPSULE | Freq: Three times a day (TID) | ORAL | 0 refills | Status: AC

## 2022-05-07 NOTE — Discharge Instructions (Signed)
You were diagnosed with folliculitis today.  I have sent out keflex to take three times/day x 7 days.  Avoid trying to pick/pop the lesions.  Avoid shaving at all while you have active sores.  Wearing cotton underwear can be helpful as well.

## 2022-05-07 NOTE — ED Provider Notes (Signed)
[facility]    CSN: [id number] Arrival date & time: 05/07/22  1429      History   Chief Complaint Chief Complaint  Patient presents with   Rash    HPI Patricia [name] is a 22 y.o. female.   Patient has noted some "bumps" at the pubic area.  Thought they were razor bumps.  One is a bit irritated.  She did try to squeeze it and a small amount of blood came out.  It is still bothersome.   Also feels a boil, which she has had in the past.        Past Medical History:  Diagnosis Date   Asthma     Patient Active Problem List   Diagnosis Date Noted   Migraine without aura and without status migrainosus, not intractable 03/20/2022   SAB (spontaneous abortion) 03/20/2022    Past Surgical History:  Procedure Laterality Date   TONSILLECTOMY     TYMPANOSTOMY TUBE PLACEMENT      OB History     Gravida  1   Para      Term      Preterm      AB  1   Living         SAB  1   IAB      Ectopic      Multiple      Live Births               Home Medications    Prior to Admission medications   Medication Sig Start Date End Date Taking? Authorizing Provider  albuterol (PROVENTIL) (2.5 MG/3ML) 0.083% nebulizer solution Take 3 mLs (2.5 mg total) by nebulization every 6 (six) hours as needed for wheezing or shortness of breath. 02/21/21   Wyvonnia Dusky, MD  albuterol (VENTOLIN HFA) 108 (90 Base) MCG/ACT inhaler Inhale 1-2 puffs into the lungs every 6 (six) hours as needed for wheezing or shortness of breath. 04/15/21   Loni Beckwith, PA-C  cyclobenzaprine (FLEXERIL) 5 MG tablet Take 1-2 tablets (5-10 mg total) by mouth at bedtime as needed for muscle spasms. 03/20/22   Leftwich-Kirby, Kathie Dike, CNM  fluticasone (FLONASE) 50 MCG/ACT nasal spray Place 1 spray into both nostrils daily. 03/05/22   [provider]  Lactic Ac-Citric Ac-Pot Bitart (PHEXXI) 1.8-1-0.4 % GEL Place 5 g vaginally as needed. 03/25/22   Leftwich-Kirby, Kathie Dike, CNM   Nebulizer System All-In-One MISC 1 application by Does not apply route every 4 (four) hours as needed. Use with nebulized albuterol. 08/22/20   Hans Eden, NP  loratadine (CLARITIN) 10 MG tablet Take 1 tablet (10 mg total) by mouth daily. Patient not taking: No sig reported 12/07/11 06/11/20  Awilda Metro, NP    Family History Family History  Problem Relation Age of Onset   Kidney Stones Mother    Asthma Father     Social History Social History   Tobacco Use   Smoking status: Every Day    Types: Cigars    Passive exposure: Current   Smokeless tobacco: Never   Tobacco comments:    Black and mild every day  Vaping Use   Vaping Use: Never used  Substance Use Topics   Alcohol use: Yes    Comment: occ.   Drug use: Yes    Frequency: 7.0 times per week    Types: Marijuana    Comment: daily     Allergies   Patient has no known allergies.  Review of Systems Review of Systems  Constitutional: Negative.   HENT: Negative.    Respiratory: Negative.    Cardiovascular: Negative.   Gastrointestinal: Negative.   Skin:  Positive for rash.     Physical Exam Triage Vital Signs ED Triage Vitals  Enc Vitals Group     BP 05/07/22 1440 105/63     Pulse Rate 05/07/22 1440 81     Resp 05/07/22 1440 14     Temp 05/07/22 1440 98.7 F (37.1 C)     Temp src --      SpO2 05/07/22 1440 98 %     Weight --      Height --      Head Circumference --      Peak Flow --      Pain Score 05/07/22 1439 5     Pain Loc --      Pain Edu? --      Excl. in [place]? --    No data found.  Updated Vital Signs BP 105/63 (BP Location: Left Arm)   Pulse 81   Temp 98.7 F (37.1 C)   Resp 14   LMP 04/12/2022   SpO2 98%   Visual Acuity Right Eye Distance:   Left Eye Distance:   Bilateral Distance:    Right Eye Near:   Left Eye Near:    Bilateral Near:     Physical Exam Constitutional:      Appearance: Normal appearance.  Cardiovascular:     Rate and Rhythm: Normal rate and  regular rhythm.  Pulmonary:     Effort: Pulmonary effort is normal.     Breath sounds: Normal breath sounds.  Skin:    Comments: At the pubic area area about 5 raised infected hair follicles.  One is a bit bigger and more sore.  No abscess or fluctuance is noted;  no area of firmness to be drained.   Neurological:     General: No focal deficit present.     Mental Status: She is alert.  Psychiatric:        Mood and Affect: Mood normal.      UC Treatments / Results  Labs (all labs ordered are listed, but only abnormal results are displayed) Labs Reviewed - No data to display  EKG   Radiology No results found.  Procedures Procedures (including critical care time)  Medications Ordered in UC Medications - No data to display  Initial Impression / Assessment and Plan / UC Course  I have reviewed the triage vital signs and the nursing notes.  Pertinent labs & imaging results that were available during my care of the patient were reviewed by me and considered in my medical decision making (see chart for details).   Final Clinical Impressions(s) / UC Diagnoses   Final diagnoses:  Folliculitis     Discharge Instructions      You were diagnosed with folliculitis today.  I have sent out keflex to take three times/day x 7 days.  Avoid trying to pick/pop the lesions.  Avoid shaving at all while you have active sores.  Wearing cotton underwear can be helpful as well.      ED Prescriptions     Medication Sig Dispense Auth. Provider   cephALEXin (KEFLEX) 500 MG capsule Take 1 capsule (500 mg total) by mouth 3 (three) times daily for 7 days. 21 capsule Rondel Oh, MD      PDMP not reviewed this encounter.   Rondel Oh, MD 05/07/22 1451

## 2022-05-07 NOTE — ED Triage Notes (Signed)
Pt reports that for 3 days has bumps in vaginal area. Unsure if related to scrub that used or razor was bad. Tried popping one only blood came out. Reports that also getting a boil.

## 2022-05-13 NOTE — Progress Notes (Unsigned)
Referring:  [name], CNM [place],   [id number]  PCP: Amelia Jo, [facility] was asked to evaluate Patricia [name], a 22 year old female for a chief complaint of headaches.  Our recommendations of care will be communicated by shared medical record.    CC:  headaches  History provided from self  HPI:  Medical co-morbidities: asthma  The patient presents for evaluation of headaches which began several years ago. Headaches are associated with photophobia, nausea, and visual aura (lights in her vision). They can last several hours at a time. Her headaches were worse when she was on birth control, and have improved slightly since stopping it last year. She has difficulty quantifying the frequency of her headaches.  She is currently taking Flexeril as needed for her headaches. This helps, but makes her drowsy so she cannot take it during the day.   Headache History: Onset: several years ago Triggers: fasting, computer screens Aura: lights in her vision Location: frontal Associated Symptoms:  Photophobia: yes  Phonophobia: no  Nausea: yes Worse with activity?: yes Duration of headaches: several hours   Current Treatment: Abortive Flexeril 5 mg PRN  Preventative none  Prior Therapies                                 Flexeril 5 mg PRN   LABS: CBC    Component Value Date/Time   WBC 12.7 (H) 12/11/2021 1814   RBC 4.44 12/11/2021 1814   HGB 13.1 12/11/2021 1814   HCT 38.8 12/11/2021 1814   PLT 238 12/11/2021 1814   MCV 87.4 12/11/2021 1814   MCH 29.5 12/11/2021 1814   MCHC 33.8 12/11/2021 1814   RDW 13.3 12/11/2021 1814      Latest Ref Rng & Units 12/09/2021    8:00 AM 02/21/2019    1:35 PM 12/19/2018    5:41 AM  CMP  Glucose 70 - 99 mg/dL 124  110  106   BUN 6 - 20 mg/dL 17  14  16   $ Creatinine 0.44 - 1.00 mg/dL 0.69  0.68  0.65   Sodium 135 - 145 mmol/L 139  140  137   Potassium 3.5 - 5.1 mmol/L 4.0  3.6   3.8   Chloride 98 - 111 mmol/L 109  106  103   CO2 22 - 32 mmol/L 24  27  23   $ Calcium 8.9 - 10.3 mg/dL 9.0  8.7  9.3   Total Protein 6.5 - 8.1 g/dL  7.0    Total Bilirubin 0.3 - 1.2 mg/dL  0.9    Alkaline Phos 38 - 126 U/L  95    AST 15 - 41 U/L  17    ALT 0 - 44 U/L  20       IMAGING:  [facility] 12/19/18: unremarkable  Imaging independently reviewed on May 14, 2022   Current Outpatient Medications on File Prior to Visit  Medication Sig Dispense Refill   albuterol (PROVENTIL) (2.5 MG/3ML) 0.083% nebulizer solution Take 3 mLs (2.5 mg total) by nebulization every 6 (six) hours as needed for wheezing or shortness of breath. 75 mL 12   albuterol (VENTOLIN HFA) 108 (90 Base) MCG/ACT inhaler Inhale 1-2 puffs into the lungs every 6 (six) hours as needed for wheezing or shortness of breath. 18 g 0   cyclobenzaprine (FLEXERIL) 5 MG tablet Take 1-2 tablets (5-10 mg total) by  mouth at bedtime as needed for muscle spasms. 30 tablet 0   Nebulizer System All-In-One MISC 1 application by Does not apply route every 4 (four) hours as needed. Use with nebulized albuterol. 1 each 0   cephALEXin (KEFLEX) 500 MG capsule Take 1 capsule (500 mg total) by mouth 3 (three) times daily for 7 days. (Patient not taking: Reported on 05/14/2022) 21 capsule 0   [DISCONTINUED] loratadine (CLARITIN) 10 MG tablet Take 1 tablet (10 mg total) by mouth daily. (Patient not taking: No sig reported) 30 tablet 1   No current facility-administered medications on file prior to visit.     Allergies: No Known Allergies  Family History: Migraine or other headaches in the family:  mom Aneurysms in a first degree relative:  no Brain tumors in the family:  no Other neurological illness in the family:   no  Past Medical History: Past Medical History:  Diagnosis Date   Asthma     Past Surgical History Past Surgical History:  Procedure Laterality Date   TONSILLECTOMY     TYMPANOSTOMY TUBE PLACEMENT      Social  History: Social History   Tobacco Use   Smoking status: Every Day    Types: Cigars    Passive exposure: Current   Smokeless tobacco: Never   Tobacco comments:    Black and mild every day  Vaping Use   Vaping Use: Never used  Substance Use Topics   Alcohol use: Yes    Comment: occ.   Drug use: Yes    Frequency: 7.0 times per week    Types: Marijuana    Comment: daily    ROS: Negative for fevers, chills. Positive for headaches. All other systems reviewed and negative unless stated otherwise in HPI.   Physical Exam:   Vital Signs: BP 125/70 (BP Location: Left Arm, Patient Position: Sitting, Cuff Size: Normal)   Pulse 77   Ht 5' 3"$  (1.6 m)   Wt 198 lb (89.8 kg)   LMP 04/12/2022   BMI 35.07 kg/m  GENERAL: well appearing,in no acute distress,alert SKIN:  Color, texture, turgor normal. No rashes or lesions HEAD:  Normocephalic/atraumatic. CV:  RRR RESP: Normal respiratory effort MSK: no tenderness to palpation over occiput, neck, or shoulders  NEUROLOGICAL: Mental Status: Alert, oriented to person, place and time,Follows commands Cranial Nerves: PERRL, visual fields intact to confrontation, extraocular movements intact, facial sensation intact, no facial droop or ptosis, hearing grossly intact, no dysarthria Motor: muscle strength 5/5 both upper and lower extremities,no drift, normal tone Reflexes: 2+ throughout Sensation: intact to light touch all 4 extremities Coordination: Finger-to- nose-finger intact bilaterally Gait: normal-based   IMPRESSION: 22 year old female with a history of asthma who presents for evaluation of migraines. She is currently taking Flexeril as needed which helps somewhat but makes her drowsy. Will start Imitrex for migraine rescue. Discussed preventive medications, however patient states she struggles to remember to take medications regularly and would prefer to treat her migraines as-needed at this time.  PLAN: -Rescue: Start Imitrex 50 mg  PRN -Lifestyle information for headache prevention provided (supplements, dietary information, common triggers) -Next steps: consider Topamax or nortriptyline for migraine prevention if headache frequency increases   I spent a total of 19 minutes chart reviewing and counseling the patient. Headache education was done. Discussed treatment options including preventive and acute medications. Discussed medication side effects, adverse reactions and drug interactions. Written educational materials and patient instructions outlining all of the above were given.  Follow-up: 6  months   Genia Harold, MD 05/14/2022   11:47 AM

## 2022-05-14 ENCOUNTER — Encounter: Payer: Self-pay | Admitting: Psychiatry

## 2022-05-14 ENCOUNTER — Ambulatory Visit (INDEPENDENT_AMBULATORY_CARE_PROVIDER_SITE_OTHER): Payer: 59 | Admitting: Psychiatry

## 2022-05-14 VITALS — BP 125/70 | HR 77 | Ht 63.0 in | Wt 198.0 lb

## 2022-05-14 DIAGNOSIS — G43009 Migraine without aura, not intractable, without status migrainosus: Secondary | ICD-10-CM

## 2022-05-14 MED ORDER — SUMATRIPTAN SUCCINATE 50 MG PO TABS: 50.0000 mg | ORAL_TABLET | ORAL | 6 refills | Status: DC | PRN

## 2022-05-14 NOTE — Patient Instructions (Signed)
Start sumatriptan (Imitrex) as needed for migrianes. Take at the onset of migraine. If headache recurs or does not fully resolve, you may take a second dose after 2 hours. Please avoid taking more than 2 days per week to avoid rebound headaches.    GENERAL HEADACHE INFORMATION  Natural supplements: Magnesium Oxide or Magnesium Glycinate 500 mg at bed (up to 800 mg daily) Coenzyme Q10 300 mg in AM Vitamin B2- 200 mg twice a day  Add 1 supplement at a time since even natural supplements can have undesirable side effects. You can sometimes buy supplements cheaper (especially Coenzyme Q10) at www.https://compton-perez.com/ or at [employer].  Vitamins and herbs that show potential:  Magnesium: Magnesium (250 mg twice a day or 500 mg at bed) has a relaxant effect on smooth muscles such as blood vessels. Individuals suffering from frequent or daily headache usually have low magnesium levels which can be increase with daily supplementation of 400-750 mg. Three trials found 40-90% average headache reduction  when used as a preventative. Magnesium also demonstrated the benefit in menstrually related migraine.  Magnesium is part of the messenger system in the serotonin cascade and it is a good muscle relaxant.  It is also useful for constipation which can be a side effect of other medications used to treat migraine. Good sources include nuts, whole grains, and tomatoes. Side Effects: loose stool/diarrhea Riboflavin (vitamin B 2) 200 mg twice a day. This vitamin assists nerve cells in the production of ATP a principal energy storing molecule.  It is necessary for many chemical reactions in the body.  There have been at least 3 clinical trials of riboflavin using 400 mg per day all of which suggested that migraine frequency can be decreased.  All 3 trials showed significant improvement in over half of migraine sufferers.  The supplement is found in bread, cereal, milk, meat, and poultry.  Most Americans get more riboflavin than the  recommended daily allowance, however riboflavin deficiency is not necessary for the supplements to help prevent headache. Side effects: energizing, green urine  Coenzyme Q10: This is present in almost all cells in the body and is critical component for the conversion of energy.  Recent studies have shown that a nutritional supplement of CoQ10 can reduce the frequency of migraine attacks by improving the energy production of cells as with riboflavin.  Doses of 150 mg twice a day have been shown to be effective.  Melatonin: Increasing evidence shows correlation between melatonin secretion and headache conditions.  Melatonin supplementation has decreased headache intensity and duration.  It is widely used as a sleep aid.  Sleep is natures way of dealing with migraine.  A dose of 3 mg is recommended to start for headaches including cluster headache. Higher doses up to 15 mg has been reviewed for use in Cluster headache and have been used. The rationale behind using melatonin for cluster is that many theories regarding the cause of Cluster headache center around the disruption of the normal circadian rhythm in the brain.  This helps restore the normal circadian rhythm.  Ginger: Ginger has a small amount of antihistamine and anti-inflammatory action which may help headache.  It is primarily used for nausea and may aid in the absorption of other medications. HEADACHE DIET: Foods and beverages which may trigger migraine Note that only 20% of headache patients are food sensitive. You will know if you are food sensitive if you get a headache consistently 20 minutes to 2 hours after eating a certain food. Only  cut out a food if it causes headaches, otherwise you might remove foods you enjoy! What matters most for diet is to eat a well balanced healthy diet full of vegetables and low fat protein, and to not miss meals.  Chocolate, other sweets ALL cheeses except cottage and cream cheese Dairy products, yogurt, sour  cream, ice cream Liver Meat extracts (Bovril, Marmite, meat tenderizers) Meats or fish which have undergone aging, fermenting, pickling or smoking. These include: Hotdogs,salami,Lox,sausage, mortadellas,smoked salmon, pepperoni, Pickled herring Pods of broad bean (English beans, Chinese pea pods, [place] (fava) beans, lima and navy beans Ripe avocado, ripe banana Yeast extracts or active yeast preparations such as Brewer's or Fleishman's (commercial bakes goods are permitted) Tomato based foods, pizza (lasagna, etc.)  MSG (monosodium glutamate) is disguised as many things; look for these common aliases: Monopotassium glutamate Autolysed yeast Hydrolysed protein Sodium caseinate "flavorings" "all natural preservatives" Nutrasweet  Avoid all other foods that convincingly provoke headaches.  Resources: The Dizzy Lu Duffel Your Headache Diet, migrainestrong.com  https://www.aguirre.org/  Caffeine and Migraine For patients that have migraine, caffeine intake more than 3 days per week can lead to dependency and increased migraine frequency. I would recommend cutting back on your caffeine intake as best you can. The recommended amount of caffeine is 200-300 mg daily, although migraine patients may experience dependency at even lower doses. While you may notice an increase in headache temporarily, cutting back will be helpful for headaches in the long run. For more information on caffeine and migraine, visit: https://americanmigrainefoundation.org/resource-library/caffeine-and-migraine/  Headache Prevention Strategies:  1. Maintain a headache diary; learn to identify and avoid triggers.  - This can be a simple note where you log when you had a headache, associated symptoms, and medications used - There are several smartphone apps developed to help track migraines: Migraine Buddy, Migraine Monitor, Curelator N1-Headache App  Common triggers  include: Emotional triggers: Emotional/Upset family or friends Emotional/Upset occupation Business reversal/success Anticipation anxiety Crisis-serious Post-crisis periodNew job/position   Physical triggers: Vacation Day Weekend Strenuous Exercise High Altitude Location New Move Menstrual Day Physical Illness Oversleep/Not enough sleep Weather changes Light: Photophobia or light sesnitivity treatment involves a balance between desensitization and reduction in overly strong input. Use dark polarized glasses outside, but not inside. Avoid bright or fluorescent light, but do not dim environment to the point that going into a normally lit room hurts. Consider FL-41 tint lenses, which reduce the most irritating wavelengths without blocking too much light.  These can be obtained at axonoptics.com or theraspecs.com Foods: see list above.  2. Limit use of acute treatments (over-the-counter medications, triptans, etc.) to no more than 2 days per week or 10 days per month to prevent medication overuse headache (rebound headache).    3. Follow a regular schedule (including weekends and holidays): Don't skip meals. Eat a balanced diet. 8 hours of sleep nightly. Minimize stress. Exercise 30 minutes per day. Being overweight is associated with a 5 times increased risk of chronic migraine. Keep well hydrated and drink 6-8 glasses of water per day.  4. Initiate non-pharmacologic measures at the earliest onset of your headache. Rest and quiet environment. Relax and reduce stress. Breathe2Relax is a free app that can instruct you on    some simple relaxtion and breathing techniques. Http://Dawnbuse.com is a    free website that provides teaching videos on relaxation.  Also, there are  many apps that   can be downloaded for "mindful" relaxation.  An app called YOGA NIDRA will help walk you through  mindfulness. Another app called Calm can be downloaded to give you a structured mindfulness guide with  daily reminders and skill development. Headspace for guided meditation Mindfulness Based Stress Reduction Online Course: www.palousemindfulness.com Cold compresses.  5. Don't wait!! Take the maximum allowable dosage of prescribed medication at the first sign of migraine.  6. Compliance:  Take prescribed medication regularly as directed and at the first sign of a migraine.  7. Communicate:  Call your physician when problems arise, especially if your headaches change, increase in frequency/severity, or become associated with neurological symptoms (weakness, numbness, slurred speech, etc.).  8. Headache/pain management therapies: Consider various complementary methods, including medication, behavioral therapy, psychological counselling, biofeedback, massage therapy, acupuncture, dry needling, and other modalities.  Such measures may reduce the need for medications. Counseling for pain management, where patients learn to function and ignore/minimize their pain, seems to work very well.  9. Recommend changing family's attention and focus away from patient's headaches. Instead, emphasize daily activities. If first question of day is 'How are your headaches/Do you have a headache today?', then patient will constantly think about headaches, thus making them worse. Goal is to re-direct attention away from headaches, toward daily activities and other distractions.  10. Helpful Websites: www.AmericanHeadacheSociety.org VoipObserver.it www.headaches.org GolfingFamily.no www.achenet.org  1

## 2022-06-08 ENCOUNTER — Encounter: Payer: Self-pay | Admitting: *Deleted

## 2022-06-08 ENCOUNTER — Other Ambulatory Visit: Payer: Self-pay | Admitting: *Deleted

## 2022-06-08 DIAGNOSIS — G43009 Migraine without aura, not intractable, without status migrainosus: Secondary | ICD-10-CM

## 2022-06-08 MED ORDER — TOPIRAMATE 25 MG PO TABS: 25.0000 mg | ORAL_TABLET | Freq: Two times a day (BID) | ORAL | 3 refills | Status: DC

## 2022-06-15 ENCOUNTER — Encounter: Payer: Self-pay | Admitting: Advanced Practice Midwife

## 2022-06-25 ENCOUNTER — Ambulatory Visit: Payer: 59 | Admitting: Obstetrics

## 2022-09-01 ENCOUNTER — Ambulatory Visit: Payer: 59

## 2022-09-03 LAB — OB RESULTS CONSOLE HIV ANTIBODY (ROUTINE TESTING): HIV: NONREACTIVE

## 2022-09-03 LAB — HEPATITIS C ANTIBODY: HCV Ab: NEGATIVE

## 2022-09-03 LAB — OB RESULTS CONSOLE RUBELLA ANTIBODY, IGM: Rubella: IMMUNE

## 2022-09-03 LAB — OB RESULTS CONSOLE HEPATITIS B SURFACE ANTIGEN: Hepatitis B Surface Ag: NEGATIVE

## 2022-09-08 ENCOUNTER — Inpatient Hospital Stay (HOSPITAL_COMMUNITY)
Admission: AD | Admit: 2022-09-08 | Discharge: 2022-09-09 | Disposition: A | Discharge status: Home / Self Care | Payer: 59 | Attending: Obstetrics and Gynecology | Admitting: Obstetrics and Gynecology

## 2022-09-08 ENCOUNTER — Encounter (HOSPITAL_COMMUNITY): Payer: Self-pay | Admitting: *Deleted

## 2022-09-08 DIAGNOSIS — F17290 Nicotine dependence, other tobacco product, uncomplicated: Secondary | ICD-10-CM | POA: Diagnosis not present

## 2022-09-08 DIAGNOSIS — O99511 Diseases of the respiratory system complicating pregnancy, first trimester: Secondary | ICD-10-CM | POA: Insufficient documentation

## 2022-09-08 DIAGNOSIS — Z3A01 Less than 8 weeks gestation of pregnancy: Secondary | ICD-10-CM | POA: Insufficient documentation

## 2022-09-08 DIAGNOSIS — O99331 Smoking (tobacco) complicating pregnancy, first trimester: Secondary | ICD-10-CM | POA: Diagnosis not present

## 2022-09-08 DIAGNOSIS — Z79899 Other long term (current) drug therapy: Secondary | ICD-10-CM | POA: Insufficient documentation

## 2022-09-08 DIAGNOSIS — O219 Vomiting of pregnancy, unspecified: Secondary | ICD-10-CM | POA: Insufficient documentation

## 2022-09-08 DIAGNOSIS — J45909 Unspecified asthma, uncomplicated: Secondary | ICD-10-CM | POA: Insufficient documentation

## 2022-09-08 LAB — POCT PREGNANCY, URINE: Preg Test, Ur: POSITIVE — AB

## 2022-09-08 MED ORDER — ONDANSETRON 4 MG PO TBDP
4.0000 mg | ORAL_TABLET | Freq: Once | ORAL | Status: AC
  Administered 2022-09-09: 4 mg via ORAL
  Filled 2022-09-08: qty 1

## 2022-09-08 MED ORDER — SCOPOLAMINE 1 MG/3DAYS TD PT72
1.0000 | MEDICATED_PATCH | Freq: Once | TRANSDERMAL | Status: DC
  Administered 2022-09-09: 1.5 mg via TRANSDERMAL
  Filled 2022-09-08: qty 1

## 2022-09-08 NOTE — MAU Provider Note (Signed)
Chief Complaint: Nausea   Event Date/Time   First Provider Initiated Contact with Patient 09/08/22 2352        SUBJECTIVE HPI: Patricia [name] is a 22 y.o. G2P0010 at [redacted]w[redacted]d by LMP who presents to maternity admissions reporting intermittent nausea and vomiting  Able to keep down some food and fluids   Has Rx for Phenergan, has only taken it once.  Has been eating fast food and fried things. Currently has potato chips and bagel with her to eat. . She denies vaginal bleeding, or fever/chills.     Emesis  This is a recurrent problem. The problem occurs 2 to 4 times per day. Pertinent negatives include no abdominal pain, chills or myalgias. She has tried nothing for the symptoms.   RN Note: Patricia [name] is a 22 y.o. at [redacted]w[redacted]d here in MAU reporting n/v for several days. Has promethazine but not helping. States can keep down some liquids. Pt mentioned eating [employer], wings, fries. Talked about trying small, frequent meals with bland, easily digested foods. Does tolerate ginger ale.               LMP: 07/16/22 Onset of complaint: several days Pain score: 0  Past Medical History:  Diagnosis Date   Asthma    Past Surgical History:  Procedure Laterality Date   TONSILLECTOMY     TYMPANOSTOMY TUBE PLACEMENT     Social History   Socioeconomic History   Marital status: Single    Spouse name: Not on file   Number of children: Not on file   Years of education: Not on file   Highest education level: Not on file  Occupational History   Not on file  Tobacco Use   Smoking status: Every Day    Types: Cigars    Passive exposure: Current   Smokeless tobacco: Never   Tobacco comments:    Black and mild every day  Vaping Use   Vaping Use: Never used  Substance and Sexual Activity   Alcohol use: Yes    Comment: occ.   Drug use: Yes    Frequency: 7.0 times per week    Types: Marijuana    Comment: daily   Sexual activity: Yes    Partners: Male    Birth control/protection: None  Other  Topics Concern   Not on file  Social History Narrative   Not on file   Social Determinants of Health   Financial Resource Strain: Not on file  Food Insecurity: Not on file  Transportation Needs: Not on file  Physical Activity: Not on file  Stress: Not on file  Social Connections: Not on file  Intimate Partner Violence: Not on file   No current facility-administered medications on file prior to encounter.   Current Outpatient Medications on File Prior to Encounter  Medication Sig Dispense Refill   albuterol (PROVENTIL) (2.5 MG/3ML) 0.083% nebulizer solution Take 3 mLs (2.5 mg total) by nebulization every 6 (six) hours as needed for wheezing or shortness of breath. 75 mL 12   albuterol (VENTOLIN HFA) 108 (90 Base) MCG/ACT inhaler Inhale 1-2 puffs into the lungs every 6 (six) hours as needed for wheezing or shortness of breath. 18 g 0   cyclobenzaprine (FLEXERIL) 5 MG tablet Take 1-2 tablets (5-10 mg total) by mouth at bedtime as needed for muscle spasms. 30 tablet 0   Nebulizer System All-In-One MISC 1 application by Does not apply route every 4 (four) hours as needed. Use with nebulized albuterol. 1 each 0  SUMAtriptan (IMITREX) 50 MG tablet Take 1 tablet (50 mg total) by mouth every 2 (two) hours as needed for migraine. May repeat in 2 hours if headache persists or recurs. 10 tablet 6   topiramate (TOPAMAX) 25 MG tablet Take 1 tablet (25 mg total) by mouth 2 (two) times daily. For the first 3 to 5 days, take 1 tablet daily. Then increased to 1 tablet twice daily after that. 60 tablet 3   [DISCONTINUED] loratadine (CLARITIN) 10 MG tablet Take 1 tablet (10 mg total) by mouth daily. (Patient not taking: No sig reported) 30 tablet 1   No Known Allergies  I have reviewed patient's Past Medical Hx, Surgical Hx, Family Hx, Social Hx, medications and allergies.   ROS:  Review of Systems  Constitutional:  Negative for chills.  Gastrointestinal:  Positive for vomiting. Negative for abdominal  pain.  Musculoskeletal:  Negative for myalgias.   Review of Systems  Other systems negative   Physical Exam  Physical Exam Patient Vitals for the past 24 hrs:  BP Temp Pulse Resp SpO2 Height Weight  09/08/22 2334 106/61 98.3 F (36.8 C) 78 17 100 % 5\' 4"  (1.626 m) 89.8 kg   Constitutional: Well-developed, well-nourished female in no acute distress.  Cardiovascular: normal rate Respiratory: normal effort MS: Extremities nontender, no edema, normal ROM Neurologic: Alert and oriented x 4.  GU: Neg CVAT.   LAB RESULTS Results for orders placed or performed during the hospital encounter of 09/08/22 (from the past 24 hour(s))  Urinalysis, Routine w reflex microscopic -Urine, Clean Catch     Status: Abnormal   Collection Time: 09/08/22 11:14 PM  Result Value Ref Range   Color, Urine AMBER (A) YELLOW   APPearance CLEAR CLEAR   Specific Gravity, Urine 1.034 (H) 1.005 - 1.030   pH 5.0 5.0 - 8.0   Glucose, UA NEGATIVE NEGATIVE mg/dL   Hgb urine dipstick NEGATIVE NEGATIVE   Bilirubin Urine SMALL (A) NEGATIVE   Ketones, ur NEGATIVE NEGATIVE mg/dL   Protein, ur NEGATIVE NEGATIVE mg/dL   Nitrite NEGATIVE NEGATIVE   Leukocytes,Ua NEGATIVE NEGATIVE  Pregnancy, urine POC     Status: Abnormal   Collection Time: 09/08/22 11:15 PM  Result Value Ref Range   Preg Test, Ur POSITIVE (A) NEGATIVE     --/--/B POS (09/14 1814)  IMAGING No results found.  MAU Management/MDM: I have reviewed the triage vital signs and the nursing notes.   Pertinent labs & imaging results that were available during my care of the patient were reviewed by me and considered in my medical decision making (see chart for details).      I have reviewed her medical records including past results, notes and treatments. Medical, Surgical, and family history were reviewed.  Medications and recent lab tests were reviewed  Discussed diet tips and avoiding fried foods DIscussed options for healthy things to try Take  Phenergan as directed Will add Scopolamine patch, placed one tonight.   ASSESSMENT SIngle IUP at [redacted]w[redacted]d Nausea and vomiting, intermittent  PLAN Discharge home Take meds prescribed from doctor as directed Rx Scopolamine prn nausea Advance diet as tolerated.  Pt stable at time of discharge. Encouraged to return here if she develops worsening of symptoms, increase in pain, fever, or other concerning symptoms.    Wynelle Bourgeois CNM, MSN Certified Nurse-Midwife 09/08/2022  11:52 PM

## 2022-09-08 NOTE — MAU Note (Signed)
.  Patricia [name] is a 22 y.o. at [redacted]w[redacted]d here in MAU reporting n/v for several days. Has promethazine but not helping. States can keep down some liquids. Pt mentioned eating [employer], wings, fries. Talked about trying small, frequent meals with bland, easily digested foods. Does tolerate ginger ale.  LMP: 07/16/22 Onset of complaint: several days Pain score: 0 Vitals:   09/08/22 2334  BP: 106/61  Pulse: 78  Resp: 17  Temp: 98.3 F (36.8 C)  SpO2: 100%     FHT:n/a Lab orders placed from triage:   U/a

## 2022-09-09 DIAGNOSIS — O219 Vomiting of pregnancy, unspecified: Secondary | ICD-10-CM | POA: Diagnosis not present

## 2022-09-09 DIAGNOSIS — Z3A01 Less than 8 weeks gestation of pregnancy: Secondary | ICD-10-CM

## 2022-09-09 LAB — URINALYSIS, ROUTINE W REFLEX MICROSCOPIC
Glucose, UA: NEGATIVE mg/dL
Hgb urine dipstick: NEGATIVE
Ketones, ur: NEGATIVE mg/dL
Leukocytes,Ua: NEGATIVE
Nitrite: NEGATIVE
Protein, ur: NEGATIVE mg/dL
Specific Gravity, Urine: 1.034 — ABNORMAL HIGH (ref 1.005–1.030)
pH: 5 (ref 5.0–8.0)

## 2022-09-09 MED ORDER — SCOPOLAMINE 1 MG/3DAYS TD PT72: 1.0000 | MEDICATED_PATCH | TRANSDERMAL | 0 refills | Status: DC

## 2022-09-09 NOTE — Progress Notes (Signed)
WRitten and verbal d/c instructions given and understanding voiced.  

## 2022-09-15 ENCOUNTER — Inpatient Hospital Stay (HOSPITAL_COMMUNITY)
Admission: AD | Admit: 2022-09-15 | Discharge: 2022-09-16 | Disposition: A | Discharge status: Home / Self Care | Payer: 59 | Attending: Obstetrics and Gynecology | Admitting: Obstetrics and Gynecology

## 2022-09-15 ENCOUNTER — Inpatient Hospital Stay (HOSPITAL_COMMUNITY): Payer: 59

## 2022-09-15 DIAGNOSIS — O219 Vomiting of pregnancy, unspecified: Secondary | ICD-10-CM | POA: Insufficient documentation

## 2022-09-15 DIAGNOSIS — O3680X0 Pregnancy with inconclusive fetal viability, not applicable or unspecified: Secondary | ICD-10-CM

## 2022-09-15 DIAGNOSIS — O26891 Other specified pregnancy related conditions, first trimester: Secondary | ICD-10-CM | POA: Diagnosis not present

## 2022-09-15 DIAGNOSIS — O26899 Other specified pregnancy related conditions, unspecified trimester: Secondary | ICD-10-CM

## 2022-09-15 DIAGNOSIS — R102 Pelvic and perineal pain: Secondary | ICD-10-CM | POA: Diagnosis not present

## 2022-09-15 DIAGNOSIS — Z3A09 9 weeks gestation of pregnancy: Secondary | ICD-10-CM | POA: Insufficient documentation

## 2022-09-15 DIAGNOSIS — R112 Nausea with vomiting, unspecified: Secondary | ICD-10-CM | POA: Diagnosis not present

## 2022-09-15 DIAGNOSIS — Z3A08 8 weeks gestation of pregnancy: Secondary | ICD-10-CM

## 2022-09-15 DIAGNOSIS — R109 Unspecified abdominal pain: Secondary | ICD-10-CM | POA: Diagnosis not present

## 2022-09-15 LAB — CBC
HCT: 39.4 % (ref 36.0–46.0)
Hemoglobin: 13.2 g/dL (ref 12.0–15.0)
MCH: 28.7 pg (ref 26.0–34.0)
MCHC: 33.5 g/dL (ref 30.0–36.0)
MCV: 85.7 fL (ref 80.0–100.0)
Platelets: 235 10*3/uL (ref 150–400)
RBC: 4.6 MIL/uL (ref 3.87–5.11)
RDW: 13.2 % (ref 11.5–15.5)
WBC: 16.7 10*3/uL — ABNORMAL HIGH (ref 4.0–10.5)
nRBC: 0 % (ref 0.0–0.2)

## 2022-09-15 LAB — COMPREHENSIVE METABOLIC PANEL
ALT: 23 U/L (ref 0–44)
AST: 17 U/L (ref 15–41)
Albumin: 3.5 g/dL (ref 3.5–5.0)
Alkaline Phosphatase: 66 U/L (ref 38–126)
Anion gap: 14 (ref 5–15)
BUN: 15 mg/dL (ref 6–20)
CO2: 21 mmol/L — ABNORMAL LOW (ref 22–32)
Calcium: 9.3 mg/dL (ref 8.9–10.3)
Chloride: 99 mmol/L (ref 98–111)
Creatinine, Ser: 0.67 mg/dL (ref 0.44–1.00)
GFR, Estimated: >60 mL/min (ref >60)
Glucose, Bld: 96 mg/dL (ref 70–99)
Potassium: 3.7 mmol/L (ref 3.5–5.1)
Sodium: 134 mmol/L — ABNORMAL LOW (ref 135–145)
Total Bilirubin: 0.8 mg/dL (ref 0.3–1.2)
Total Protein: 6.9 g/dL (ref 6.5–8.1)

## 2022-09-15 LAB — URINALYSIS, ROUTINE W REFLEX MICROSCOPIC
Bilirubin Urine: NEGATIVE
Glucose, UA: NEGATIVE mg/dL
Hgb urine dipstick: NEGATIVE
Ketones, ur: 5 mg/dL — AB
Leukocytes,Ua: NEGATIVE
Nitrite: NEGATIVE
Protein, ur: NEGATIVE mg/dL
Specific Gravity, Urine: 1.028 (ref 1.005–1.030)
pH: 5 (ref 5.0–8.0)

## 2022-09-15 LAB — HCG, QUANTITATIVE, PREGNANCY: hCG, Beta Chain, Quant, S: 182025 m[IU]/mL — ABNORMAL HIGH (ref <5)

## 2022-09-15 NOTE — MAU Note (Signed)
.  Patricia [name] is a 22 y.o. at [redacted]w[redacted]d here in MAU reporting n/v and unable to keep down anything. Unable to keep down nausea meds and patch not helping. Some lower abd cramping and has a headache.   Onset of complaint: Sunday Pain score: 7 for cramping and 9 for h/a Vitals:   09/15/22 1956 09/15/22 1959  BP:  123/65  Pulse: 68   Resp: 17   Temp: 97.7 F (36.5 C)   SpO2: 100%      FHT:n/a Lab orders placed from triage:  u/a

## 2022-09-16 ENCOUNTER — Encounter (HOSPITAL_COMMUNITY): Payer: Self-pay | Admitting: Obstetrics and Gynecology

## 2022-09-16 DIAGNOSIS — R112 Nausea with vomiting, unspecified: Secondary | ICD-10-CM | POA: Diagnosis not present

## 2022-09-16 DIAGNOSIS — O26891 Other specified pregnancy related conditions, first trimester: Secondary | ICD-10-CM | POA: Diagnosis not present

## 2022-09-16 DIAGNOSIS — O219 Vomiting of pregnancy, unspecified: Secondary | ICD-10-CM | POA: Diagnosis not present

## 2022-09-16 DIAGNOSIS — R109 Unspecified abdominal pain: Secondary | ICD-10-CM

## 2022-09-16 DIAGNOSIS — O3680X0 Pregnancy with inconclusive fetal viability, not applicable or unspecified: Secondary | ICD-10-CM

## 2022-09-16 DIAGNOSIS — Z3A08 8 weeks gestation of pregnancy: Secondary | ICD-10-CM

## 2022-09-16 MED ORDER — ONDANSETRON 4 MG PO TBDP
4.0000 mg | ORAL_TABLET | Freq: Once | ORAL | Status: AC
  Administered 2022-09-16: 4 mg via ORAL
  Filled 2022-09-16: qty 1

## 2022-09-16 MED ORDER — METOCLOPRAMIDE HCL 10 MG PO TABS: 10.0000 mg | ORAL_TABLET | Freq: Four times a day (QID) | ORAL | 0 refills | Status: DC | PRN

## 2022-09-16 MED ORDER — METOCLOPRAMIDE HCL 5 MG/ML IJ SOLN
10.0000 mg | Freq: Once | INTRAMUSCULAR | Status: AC
  Administered 2022-09-16: 10 mg via INTRAVENOUS
  Filled 2022-09-16: qty 2

## 2022-09-16 MED ORDER — DIPHENHYDRAMINE HCL 50 MG/ML IJ SOLN
25.0000 mg | Freq: Once | INTRAMUSCULAR | Status: AC
  Administered 2022-09-16: 25 mg via INTRAVENOUS
  Filled 2022-09-16: qty 1

## 2022-09-16 MED ORDER — LACTATED RINGERS IV SOLN: Freq: Once | INTRAVENOUS | Status: AC

## 2022-09-16 NOTE — MAU Provider Note (Signed)
Chief Complaint: Emesis During Pregnancy   Event Date/Time   First Provider Initiated Contact with Patient 09/16/22 0024        SUBJECTIVE HPI: Patricia [name] is a 22 y.o. G2P0010 at [redacted]w[redacted]d by LMP who presents to maternity admissions reporting nausea and vomiting.  Unable to keep anything down. States meds are not helping. Has some pelvic cramping and a headache. . She denies vaginal bleeding, urinary symptoms, or fever/chills.    Emesis  This is a recurrent problem. There has been no fever. Associated symptoms include abdominal pain. Pertinent negatives include no chills, diarrhea, fever or myalgias.   RN Note: Patricia [name] is a 22 y.o. at [redacted]w[redacted]d here in MAU reporting n/v and unable to keep down anything. Unable to keep down nausea meds and patch not helping. Some lower abd cramping and has a headache.   Onset of complaint: Sunday                           Pain score: 7 for cramping and 9 for h/a  Past Medical History:  Diagnosis Date   Asthma    Past Surgical History:  Procedure Laterality Date   TONSILLECTOMY     TYMPANOSTOMY TUBE PLACEMENT     Social History   Socioeconomic History   Marital status: Single    Spouse name: Not on file   Number of children: Not on file   Years of education: Not on file   Highest education level: Not on file  Occupational History   Not on file  Tobacco Use   Smoking status: Every Day    Types: Cigars    Passive exposure: Current   Smokeless tobacco: Never   Tobacco comments:    Black and mild every day  Vaping Use   Vaping Use: Never used  Substance and Sexual Activity   Alcohol use: Yes    Comment: occ.   Drug use: Yes    Frequency: 7.0 times per week    Types: Marijuana    Comment: daily   Sexual activity: Yes    Partners: Male    Birth control/protection: None  Other Topics Concern   Not on file  Social History Narrative   Not on file   Social Determinants of Health   Financial Resource Strain: Not on file  Food  Insecurity: Not on file  Transportation Needs: Not on file  Physical Activity: Not on file  Stress: Not on file  Social Connections: Not on file  Intimate Partner Violence: Not on file   No current facility-administered medications on file prior to encounter.   Current Outpatient Medications on File Prior to Encounter  Medication Sig Dispense Refill   albuterol (PROVENTIL) (2.5 MG/3ML) 0.083% nebulizer solution Take 3 mLs (2.5 mg total) by nebulization every 6 (six) hours as needed for wheezing or shortness of breath. 75 mL 12   albuterol (VENTOLIN HFA) 108 (90 Base) MCG/ACT inhaler Inhale 1-2 puffs into the lungs every 6 (six) hours as needed for wheezing or shortness of breath. 18 g 0   Nebulizer System All-In-One MISC 1 application by Does not apply route every 4 (four) hours as needed. Use with nebulized albuterol. 1 each 0   scopolamine (TRANSDERM-SCOP) 1 MG/3DAYS Place 1 patch (1.5 mg total) onto the skin every 3 (three) days. 10 patch 0   topiramate (TOPAMAX) 25 MG tablet Take 1 tablet (25 mg total) by mouth 2 (two) times daily. For the first 3  to 5 days, take 1 tablet daily. Then increased to 1 tablet twice daily after that. 60 tablet 3   [DISCONTINUED] loratadine (CLARITIN) 10 MG tablet Take 1 tablet (10 mg total) by mouth daily. (Patient not taking: No sig reported) 30 tablet 1   No Known Allergies  I have reviewed patient's Past Medical Hx, Surgical Hx, Family Hx, Social Hx, medications and allergies.   ROS:  Review of Systems  Constitutional:  Negative for chills and fever.  Gastrointestinal:  Positive for abdominal pain and vomiting. Negative for diarrhea.  Musculoskeletal:  Negative for myalgias.   Review of Systems  Other systems negative   Physical Exam  Physical Exam Patient Vitals for the past 24 hrs:  BP Temp Pulse Resp SpO2 Height Weight  09/15/22 1959 123/65 -- -- -- -- -- --  09/15/22 1956 -- 97.7 F (36.5 C) 68 17 100 % 5\' 4"  (1.626 m) 88 kg    Constitutional: Well-developed, well-nourished female in no acute distress.  Cardiovascular: normal rate Respiratory: normal effort GI: Abd soft, non-tender.  MS: Extremities nontender, no edema, normal ROM Neurologic: Alert and oriented x 4.  GU: Neg CVAT.  PELVIC EXAM: deferred in lieu of ultrasound  LAB RESULTS Results for orders placed or performed during the hospital encounter of 09/15/22 (from the past 24 hour(s))  Urinalysis, Routine w reflex microscopic -Urine, Clean Catch     Status: Abnormal   Collection Time: 09/15/22  8:09 PM  Result Value Ref Range   Color, Urine AMBER (A) YELLOW   APPearance HAZY (A) CLEAR   Specific Gravity, Urine 1.028 1.005 - 1.030   pH 5.0 5.0 - 8.0   Glucose, UA NEGATIVE NEGATIVE mg/dL   Hgb urine dipstick NEGATIVE NEGATIVE   Bilirubin Urine NEGATIVE NEGATIVE   Ketones, ur 5 (A) NEGATIVE mg/dL   Protein, ur NEGATIVE NEGATIVE mg/dL   Nitrite NEGATIVE NEGATIVE   Leukocytes,Ua NEGATIVE NEGATIVE  CBC     Status: Abnormal   Collection Time: 09/15/22  9:01 PM  Result Value Ref Range   WBC 16.7 (H) 4.0 - 10.5 K/uL   RBC 4.60 3.87 - 5.11 MIL/uL   Hemoglobin 13.2 12.0 - 15.0 g/dL   HCT 40.9 81.1 - 91.4 %   MCV 85.7 80.0 - 100.0 fL   MCH 28.7 26.0 - 34.0 pg   MCHC 33.5 30.0 - 36.0 g/dL   RDW 78.2 95.6 - 21.3 %   Platelets 235 150 - 400 K/uL   nRBC 0.0 0.0 - 0.2 %  Comprehensive metabolic panel     Status: Abnormal   Collection Time: 09/15/22  9:01 PM  Result Value Ref Range   Sodium 134 (L) 135 - 145 mmol/L   Potassium 3.7 3.5 - 5.1 mmol/L   Chloride 99 98 - 111 mmol/L   CO2 21 (L) 22 - 32 mmol/L   Glucose, Bld 96 70 - 99 mg/dL   BUN 15 6 - 20 mg/dL   Creatinine, Ser 0.86 0.44 - 1.00 mg/dL   Calcium 9.3 8.9 - 57.8 mg/dL   Total Protein 6.9 6.5 - 8.1 g/dL   Albumin 3.5 3.5 - 5.0 g/dL   AST 17 15 - 41 U/L   ALT 23 0 - 44 U/L   Alkaline Phosphatase 66 38 - 126 U/L   Total Bilirubin 0.8 0.3 - 1.2 mg/dL   GFR, Estimated >46 >96 mL/min    Anion gap 14 5 - 15  hCG, quantitative, pregnancy     Status: Abnormal  Collection Time: 09/15/22  9:01 PM  Result Value Ref Range   hCG, Beta Chain, Quant, S 182,025 (H) <5 mIU/mL    --/--/B POS (09/14 1814)  IMAGING US OB LESS THAN 14 WEEKS WITH OB TRANSVAGINAL  Result Date: 09/15/2022 CLINICAL DATA:  Cramping and nausea and vomiting with positive pregnancy test EXAM: OBSTETRIC <14 WK [place] AND TRANSVAGINAL OB US TECHNIQUE: Both transabdominal and transvaginal ultrasound examinations were performed for complete evaluation of the gestation as well as the maternal uterus, adnexal regions, and pelvic cul-de-sac. Transvaginal technique was performed to assess early pregnancy. COMPARISON:  None Available. FINDINGS: Intrauterine gestational sac: Present Yolk sac:  Visualized. Embryo:  Visualized. Cardiac Activity: Visualized. Heart Rate: 161 bpm CRL:  16 point mm   8 w   1 d                  [place] EDC: 04/26/2023 Subchorionic hemorrhage:  None visualized. Maternal uterus/adnexae: Right ovary is within normal limits. Left ovary is not well visualized. No free fluid is seen. IMPRESSION: Single live intrauterine gestation at 8 weeks 1 day. Electronically Signed   By: Alcide Clever M.D.   On: 09/15/2022 23:47    MAU Management/MDM: I have reviewed the triage vital signs and the nursing notes.   Pertinent labs & imaging results that were available during my care of the patient were reviewed by me and considered in my medical decision making (see chart for details).      I have reviewed her medical records including past results, notes and treatments. Medical, Surgical, and family history were reviewed.  Medications and recent lab tests were reviewed  Ordered baseline Ultrasound to rule out ectopic.  THis confirmed live single intrauterine pregnancy  Treatments in MAU included IV hydration, Reglan, Benadryl and zofran for nausea.. She was able to tolerate PO intake after this treatment.  This bleeding/pain  can represent a normal pregnancy with bleeding, spontaneous abortion or even an ectopic which can be life-threatening.  The process as listed above helps to determine which of these is present.    ASSESSMENT Single IUP at [redacted]w[redacted]d Cramping in pregnancy Pregnancy of unknown location, now known to be intrauterine Nausea and vomiting    PLAN Discharge home Rx Reglan for nausea Continue other meds as prescribed Followup at [facility]   Pt stable at time of discharge. Encouraged to return here if she develops worsening of symptoms, increase in pain, fever, or other concerning symptoms.    Wynelle Bourgeois CNM, MSN Certified Nurse-Midwife 09/16/2022  12:25 AM

## 2022-09-29 ENCOUNTER — Other Ambulatory Visit: Payer: Self-pay | Admitting: Obstetrics and Gynecology

## 2022-09-29 ENCOUNTER — Other Ambulatory Visit (HOSPITAL_COMMUNITY)
Admission: RE | Admit: 2022-09-29 | Discharge: 2022-09-29 | Disposition: A | Discharge status: Home / Self Care | Payer: 59 | Source: Ambulatory Visit | Attending: Obstetrics and Gynecology | Admitting: Obstetrics and Gynecology

## 2022-09-29 DIAGNOSIS — Z3490 Encounter for supervision of normal pregnancy, unspecified, unspecified trimester: Secondary | ICD-10-CM | POA: Insufficient documentation

## 2022-09-30 LAB — CYTOLOGY - PAP: Diagnosis: NEGATIVE

## 2022-11-17 ENCOUNTER — Inpatient Hospital Stay (HOSPITAL_COMMUNITY)
Admission: AD | Admit: 2022-11-17 | Discharge: 2022-11-17 | Disposition: A | Discharge status: Home / Self Care | Payer: 59 | Attending: Obstetrics and Gynecology | Admitting: Obstetrics and Gynecology

## 2022-11-17 ENCOUNTER — Encounter (HOSPITAL_COMMUNITY): Payer: Self-pay | Admitting: Obstetrics and Gynecology

## 2022-11-17 DIAGNOSIS — R109 Unspecified abdominal pain: Secondary | ICD-10-CM | POA: Diagnosis not present

## 2022-11-17 DIAGNOSIS — O99612 Diseases of the digestive system complicating pregnancy, second trimester: Secondary | ICD-10-CM | POA: Insufficient documentation

## 2022-11-17 DIAGNOSIS — Z87891 Personal history of nicotine dependence: Secondary | ICD-10-CM | POA: Diagnosis not present

## 2022-11-17 DIAGNOSIS — Z3A17 17 weeks gestation of pregnancy: Secondary | ICD-10-CM | POA: Diagnosis not present

## 2022-11-17 DIAGNOSIS — K5900 Constipation, unspecified: Secondary | ICD-10-CM | POA: Diagnosis present

## 2022-11-17 LAB — URINALYSIS, ROUTINE W REFLEX MICROSCOPIC
Bilirubin Urine: NEGATIVE
Glucose, UA: NEGATIVE mg/dL
Hgb urine dipstick: NEGATIVE
Ketones, ur: NEGATIVE mg/dL
Leukocytes,Ua: NEGATIVE
Nitrite: NEGATIVE
Protein, ur: NEGATIVE mg/dL
Specific Gravity, Urine: 1.023 (ref 1.005–1.030)
pH: 5 (ref 5.0–8.0)

## 2022-11-17 MED ORDER — POLYETHYLENE GLYCOL 3350 17 GM/SCOOP PO POWD: 17.0000 g | Freq: Every day | ORAL | 0 refills | Status: DC

## 2022-11-17 MED ORDER — SENNOSIDES-DOCUSATE SODIUM 8.6-50 MG PO TABS: 1.0000 | ORAL_TABLET | Freq: Every day | ORAL | 1 refills | Status: DC

## 2022-11-17 NOTE — MAU Note (Addendum)
Patricia [name] is a 22 y.o. at [redacted]w[redacted]d here in MAU reporting: having pain in LLQ, that radiates to the back.  Started last night when laying on rt side.   Denies fever or diarrhea, has been constipated. No urinary symptoms.  Onset of complaint: last night Pain score: 8 Vitals:   11/17/22 1759  BP: 129/61  Pulse: 86  Resp: 16  Temp: 98.5 F (36.9 C)  SpO2: 98%     [id number] dress on, will check in rm Lab orders placed from triage:  urine

## 2022-11-17 NOTE — MAU Provider Note (Signed)
History     CSN: [id number]  Arrival date and time: 11/17/22 1747   Event Date/Time   First Provider Initiated Contact with Patient 11/17/22 1830      Chief Complaint  Patient presents with   Abdominal Pain   Patricia [name] is a 22 y.o. G2P0010 at [redacted]w[redacted]d senting with left side pain that started yesterday while she was lying on her right side.  She denies any numbness or tingling down her leg.  She denies any aggravating or alleviating symptoms.  She does report that it is slightly better when she lies down.  She is not currently having the pain but had intermittently over the last 2 to 3 days.  She reports decreased water intake typically she is drinking 5 bottles of water and is now only drinking 1.  She reports that she had a relatively stopped soft stool about 2 to 3 days ago within yesterday had little tiny balls of stool.  She reports that she does have to strain to have a bowel movement.  She denies any dysuria or polyuria.  Denies any fevers, chills, nausea or vomiting.  Abdominal Pain Pertinent negatives include no diarrhea, dysuria, fever, nausea or vomiting.    OB History     Gravida  2   Para      Term      Preterm      AB  1   Living         SAB  1   IAB      Ectopic      Multiple      Live Births              Past Medical History:  Diagnosis Date   Asthma     Past Surgical History:  Procedure Laterality Date   TONSILLECTOMY     TYMPANOSTOMY TUBE PLACEMENT      Family History  Problem Relation Age of Onset   Kidney Stones Mother    Asthma Father     Social History   Tobacco Use   Smoking status: Former    Types: Cigars    Passive exposure: Current   Smokeless tobacco: Never   Tobacco comments:    Black and mild every day  Vaping Use   Vaping status: Never Used  Substance Use Topics   Alcohol use: Not Currently    Comment: occ.   Drug use: Yes    Frequency: 7.0 times per week    Types: Marijuana    Comment: last smoked June  2024    Allergies: No Known Allergies  Medications Prior to Admission  Medication Sig Dispense Refill Last Dose   albuterol (VENTOLIN HFA) 108 (90 Base) MCG/ACT inhaler Inhale 1-2 puffs into the lungs every 6 (six) hours as needed for wheezing or shortness of breath. 18 g 0 11/16/2022   promethazine (PHENERGAN) 25 MG tablet Take 25 mg by mouth every 6 (six) hours as needed for nausea or vomiting.   11/14/2022   albuterol (PROVENTIL) (2.5 MG/3ML) 0.083% nebulizer solution Take 3 mLs (2.5 mg total) by nebulization every 6 (six) hours as needed for wheezing or shortness of breath. 75 mL 12 More than a month   metoCLOPramide (REGLAN) 10 MG tablet Take 1 tablet (10 mg total) by mouth every 6 (six) hours as needed for nausea or vomiting. 20 tablet 0    Nebulizer System All-In-One MISC 1 application by Does not apply route every 4 (four) hours as needed. Use with nebulized albuterol.  1 each 0    Prenatal Vit-Fe Fumarate-FA (PRENATAL MULTIVITAMIN) TABS tablet Take 1 tablet by mouth daily at 12 noon.      pyridOXINE (VITAMIN B6) 25 MG tablet Take 25 mg by mouth daily.      scopolamine (TRANSDERM-SCOP) 1 MG/3DAYS Place 1 patch (1.5 mg total) onto the skin every 3 (three) days. 10 patch 0    topiramate (TOPAMAX) 25 MG tablet Take 1 tablet (25 mg total) by mouth 2 (two) times daily. For the first 3 to 5 days, take 1 tablet daily. Then increased to 1 tablet twice daily after that. 60 tablet 3     Review of Systems  Constitutional:  Negative for chills and fever.  HENT:  Negative for congestion and sore throat.   Eyes:  Negative for pain and visual disturbance.  Respiratory:  Negative for cough, chest tightness and shortness of breath.   Cardiovascular:  Negative for chest pain.  Gastrointestinal:  Positive for abdominal pain. Negative for diarrhea, nausea and vomiting.  Endocrine: Negative for cold intolerance and heat intolerance.  Genitourinary:  Negative for dysuria and flank pain.  Musculoskeletal:   Negative for back pain.  Skin:  Negative for rash.  Allergic/Immunologic: Negative for food allergies.  Neurological:  Negative for dizziness and light-headedness.  Psychiatric/Behavioral:  Negative for agitation.    Physical Exam   Blood pressure 129/61, pulse 86, temperature 98.5 F (36.9 C), temperature source Oral, resp. rate 16, height 5\' 4"  (1.626 m), weight 88.6 kg, last menstrual period 07/16/2022, SpO2 98%.  Physical Exam Vitals and nursing note reviewed.  Constitutional:      General: She is not in acute distress.    Appearance: She is well-developed.     Comments: Pregnant female  HENT:     Head: Normocephalic and atraumatic.  Eyes:     General: No scleral icterus.    Conjunctiva/sclera: Conjunctivae normal.  Cardiovascular:     Rate and Rhythm: Normal rate.  Pulmonary:     Effort: Pulmonary effort is normal.  Chest:     Chest wall: No tenderness.  Abdominal:     General: Bowel sounds are normal.     Palpations: Abdomen is soft.     Tenderness: There is no abdominal tenderness. There is no right CVA tenderness, left CVA tenderness, guarding or rebound.     Comments: Gravid  Genitourinary:    Vagina: Normal.  Musculoskeletal:        General: Normal range of motion.     Cervical back: Normal range of motion and neck supple.  Skin:    General: Skin is warm and dry.     Findings: No rash.  Neurological:     Mental Status: She is alert and oriented to person, place, and time.     MAU Course  Procedures  MDM- low Differential for left lower quadrant pain in pregnancy includes UTI, constipation, round ligament pain, normal variation of pregnancy.  Unlikely pyelonephritis as the patient does not have any signs of fever or chills or other infectious symptoms.  No signs of infection on UA.  Reported left lower quadrant pain that wraps around the back.  Not currently having any pain.  Exam was overall benign.  Patient also endorsed lower fluid intake and  constipation over the last 2 to 3 days  Discussed with patient plan to go home with stool softeners which she is in agreement with. Assessment and Plan   1. Left sided abdominal pain   2. Constipation, unspecified  constipation type   3. [redacted] weeks gestation of pregnancy   - Discharge home Stool softener sent to pharmacy  Allergies as of 11/17/2022   No Known Allergies      Medication List     TAKE these medications    albuterol (2.5 MG/3ML) 0.083% nebulizer solution Commonly known as: PROVENTIL Take 3 mLs (2.5 mg total) by nebulization every 6 (six) hours as needed for wheezing or shortness of breath.   albuterol 108 (90 Base) MCG/ACT inhaler Commonly known as: VENTOLIN HFA Inhale 1-2 puffs into the lungs every 6 (six) hours as needed for wheezing or shortness of breath.   metoCLOPramide 10 MG tablet Commonly known as: Reglan Take 1 tablet (10 mg total) by mouth every 6 (six) hours as needed for nausea or vomiting.   Nebulizer System All-In-One Misc 1 application by Does not apply route every 4 (four) hours as needed. Use with nebulized albuterol.   polyethylene glycol powder 17 GM/SCOOP powder Commonly known as: MiraLax Take 17 g by mouth daily.   prenatal multivitamin Tabs tablet Take 1 tablet by mouth daily at 12 noon.   promethazine 25 MG tablet Commonly known as: PHENERGAN Take 25 mg by mouth every 6 (six) hours as needed for nausea or vomiting.   pyridOXINE 25 MG tablet Commonly known as: VITAMIN B6 Take 25 mg by mouth daily.   scopolamine 1 MG/3DAYS Commonly known as: TRANSDERM-SCOP Place 1 patch (1.5 mg total) onto the skin every 3 (three) days.   senna-docusate 8.6-50 MG tablet Commonly known as: Senokot-S Take 1 tablet by mouth daily.   topiramate 25 MG tablet Commonly known as: TOPAMAX Take 1 tablet (25 mg total) by mouth 2 (two) times daily. For the first 3 to 5 days, take 1 tablet daily. Then increased to 1 tablet twice daily after that.           Isa Rankin [facility] 11/17/2022, 7:31 PM

## 2022-11-22 IMAGING — CT CT MAXILLOFACIAL W/O CM
3 series · 15 of 47 positions shown, 18 images · non-contrast
Comparison: CT brain 12/19/2018

CLINICAL DATA: Facial trauma, blunt.



[Series 3: max soft · axial · 0.36mm/px · z∈[+1402,+1560]mm · 9 of 93 slices shown, 12 images]
[im 7/93  brain]
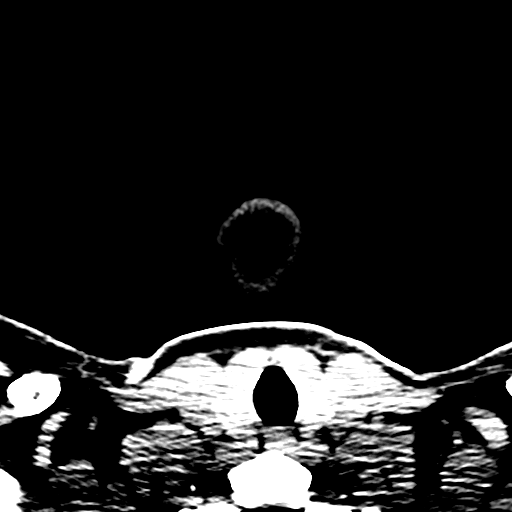
[im 7/93  bone]
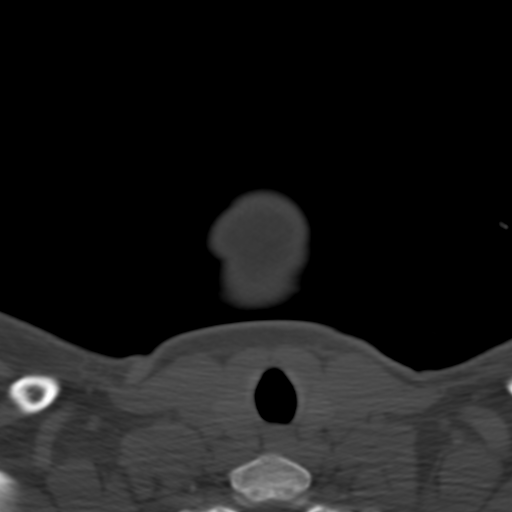
[im 16/93  bone]
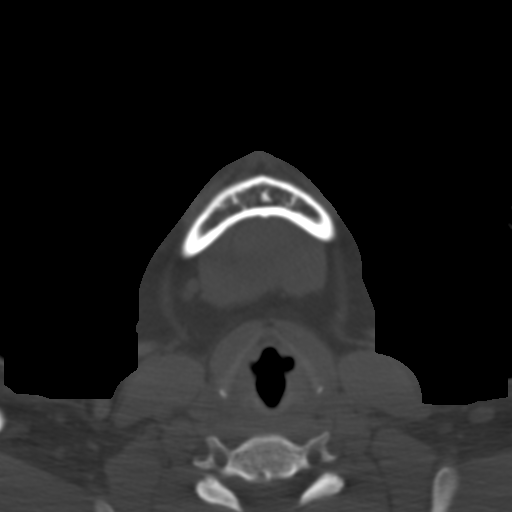
[im 26/93  bone]
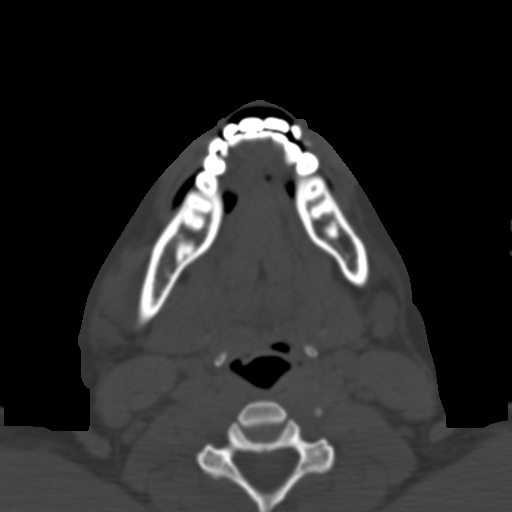
[im 35/93  bone]
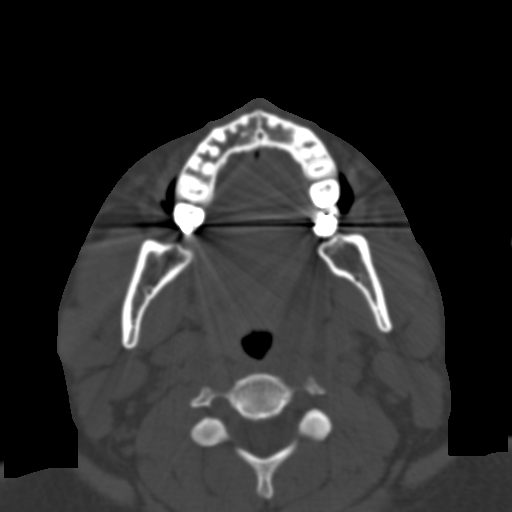
[im 48/93  brain]
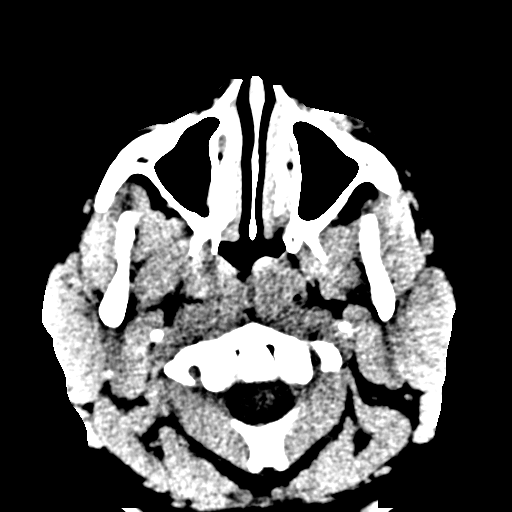
[im 48/93  bone]
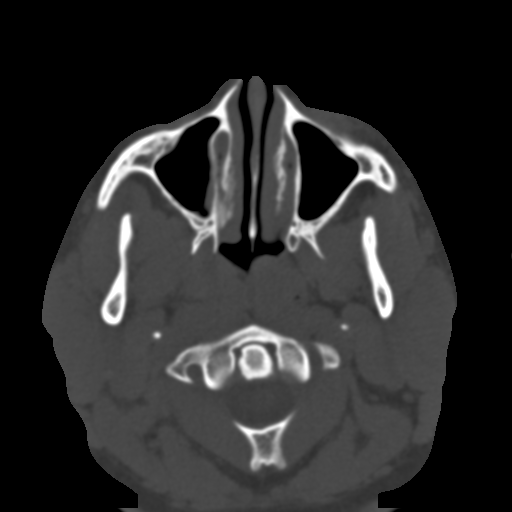
[im 58/93  bone]
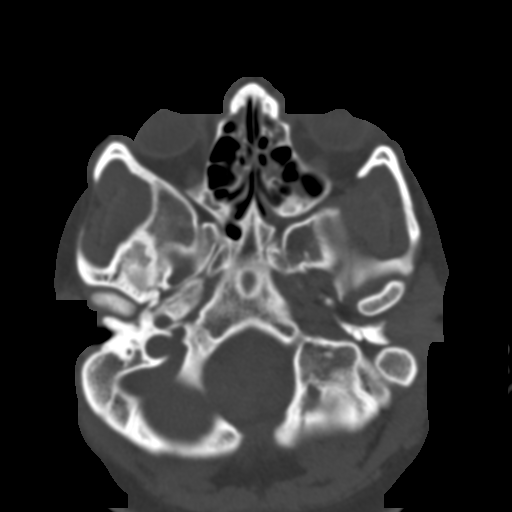
[im 67/93  bone]
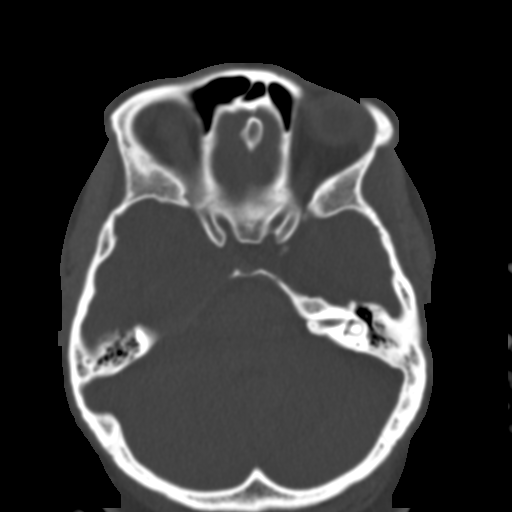
[im 77/93  bone]
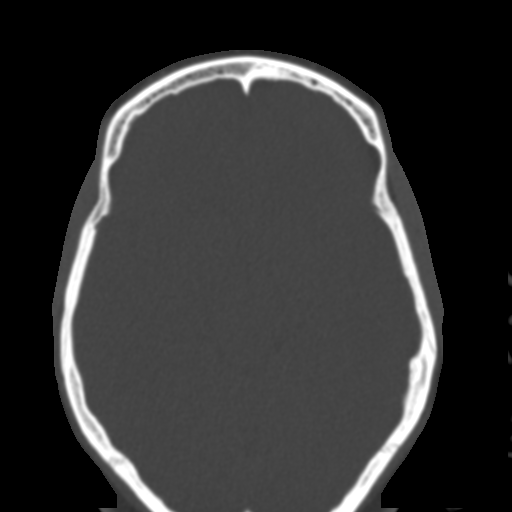
[im 86/93  brain]
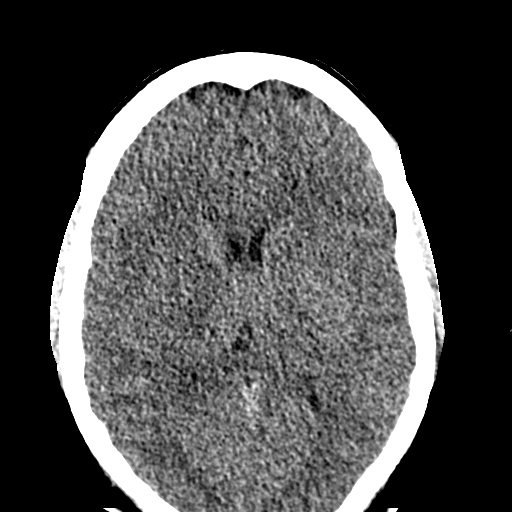
[im 86/93  bone]
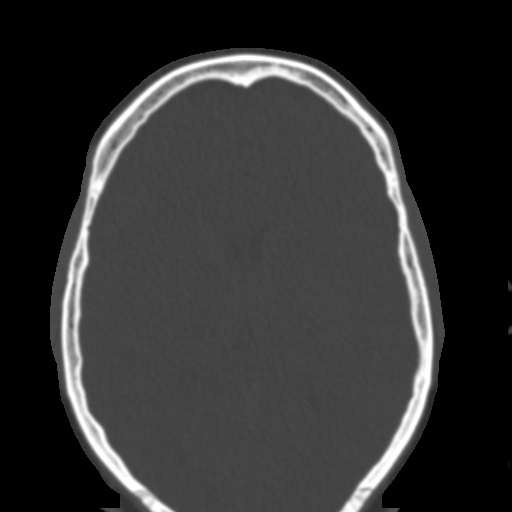

[Series 7: coronal soft · coronal · 0.37mm/px · 3 of 79 slices shown]
[im 27/79  bone]
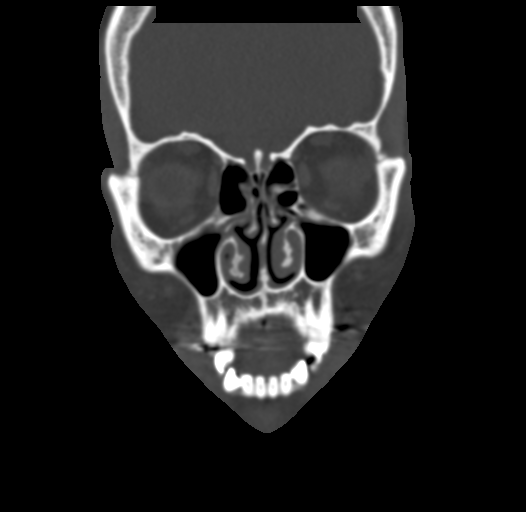
[im 35/79  bone]
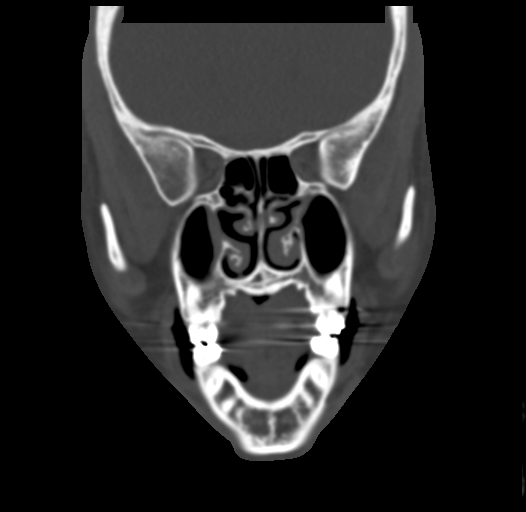
[im 44/79  bone]
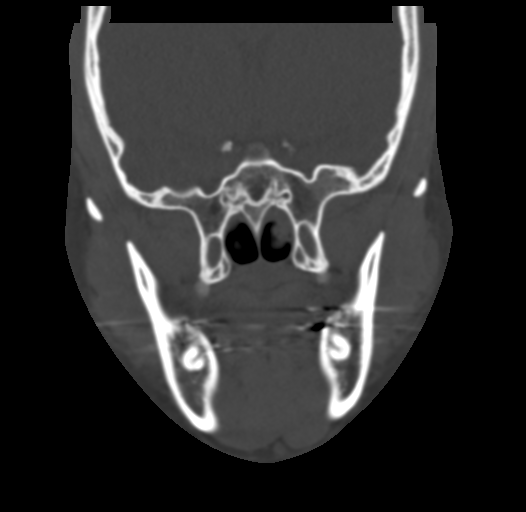

[Series 8: sagittal soft · sagittal · 0.31mm/px · 3 of 98 slices shown]
[im 33/98  bone]
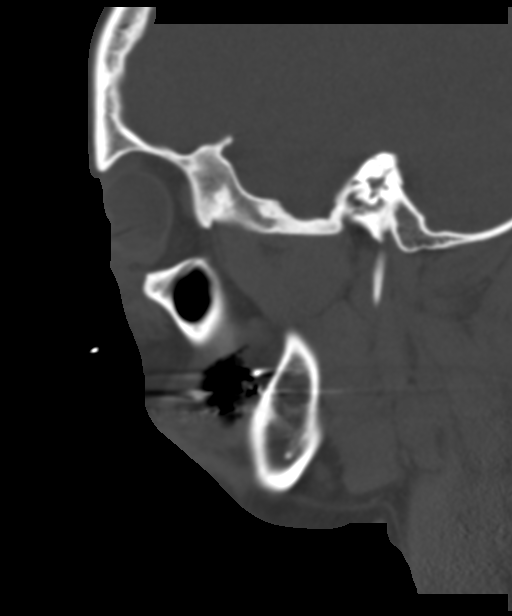
[im 49/98  bone]
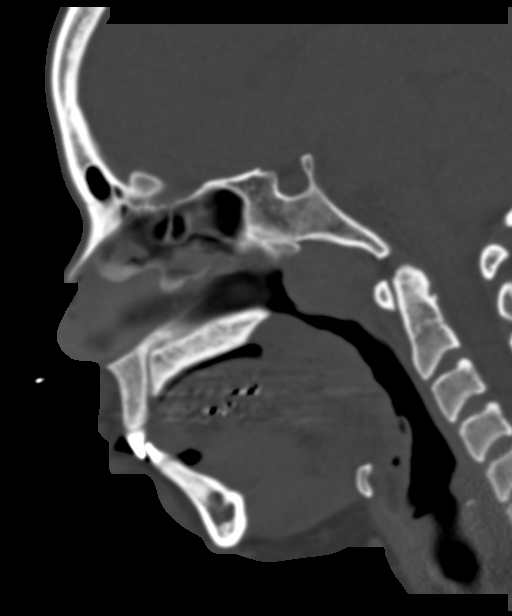
[im 65/98  bone]
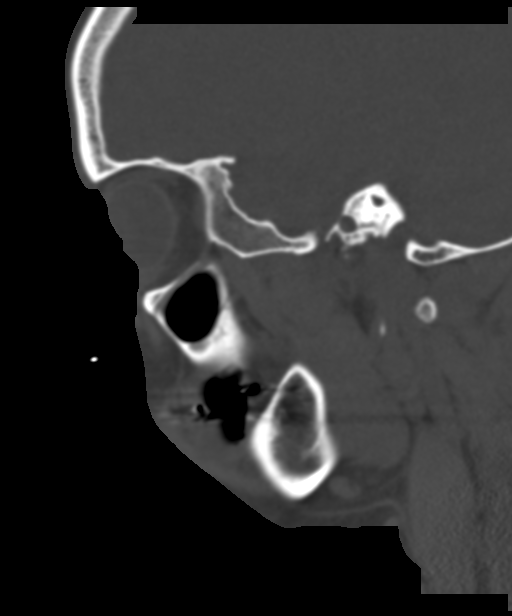

[15 of 47 positions shown; findings below may reference images not displayed]

FINDINGS: Osseous: No fracture or mandibular dislocation. No destructive
process.

Orbits: The orbital globes are intact. The extraocular muscles are
intact.

Sinuses: Mild mucosal thickening of the inferior ethmoid air cells
and adjacent medial aspect of each maxillary sinus. No air-fluid
levels. The complete right and the inferior aspect of the left
ostiomeatal complexes are opacified. The mastoid air cells are
clear.

Soft tissues: Moderate soft tissue swelling of the left infraorbital
malar/cheek region. Mild lateral and superior left periorbital soft
tissue swelling.

Limited intracranial: No gross abnormality within the visualized
inferior aspect of the brain.
IMPRESSION: Moderate left malar/cheek and mild lateral and superior left
periorbital soft tissue swelling. No acute fracture is seen.

Mild ethmoid air cell and maxillary sinus mucosal thickening with
opacification of the complete right and inferior aspect of the left
ostiomeatal complexes. No paranasal sinus air-fluid levels.

## 2022-12-11 ENCOUNTER — Inpatient Hospital Stay (HOSPITAL_COMMUNITY)
Admission: AD | Admit: 2022-12-11 | Discharge: 2022-12-11 | Disposition: A | Discharge status: Home / Self Care | Payer: 59 | Attending: Obstetrics and Gynecology | Admitting: Obstetrics and Gynecology

## 2022-12-11 ENCOUNTER — Other Ambulatory Visit: Payer: Self-pay

## 2022-12-11 DIAGNOSIS — W1830XA Fall on same level, unspecified, initial encounter: Secondary | ICD-10-CM | POA: Diagnosis not present

## 2022-12-11 DIAGNOSIS — O26892 Other specified pregnancy related conditions, second trimester: Secondary | ICD-10-CM | POA: Diagnosis not present

## 2022-12-11 DIAGNOSIS — W19XXXA Unspecified fall, initial encounter: Secondary | ICD-10-CM

## 2022-12-11 DIAGNOSIS — Z3A21 21 weeks gestation of pregnancy: Secondary | ICD-10-CM | POA: Diagnosis not present

## 2022-12-11 DIAGNOSIS — Y939 Activity, unspecified: Secondary | ICD-10-CM | POA: Diagnosis not present

## 2022-12-11 DIAGNOSIS — Z3492 Encounter for supervision of normal pregnancy, unspecified, second trimester: Secondary | ICD-10-CM

## 2022-12-11 NOTE — MAU Provider Note (Signed)
Event Date/Time   First Provider Initiated Contact with Patient 12/11/22 1231     S [name] is a 22 y.o. G2P0010 pregnant female at [redacted]w[redacted]d who presents to MAU today with complaint of a twisted ankle, she was walking outside and fell to her knees after twisting her ankle. Can bear weight on it, just concerned.    Receives care at [facility]. Prenatal records reviewed.  Pertinent items noted in HPI and remainder of comprehensive ROS otherwise negative.   O BP 122/64 (BP Location: Right Arm)   Pulse 83   Temp 98.1 F (36.7 C) (Oral)   Resp 18   Ht 5\' 4"  (1.626 m)   Wt 200 lb 1.6 oz (90.8 kg)   LMP 07/16/2022   SpO2 99%   BMI 34.35 kg/m  Physical Exam Vitals and nursing note reviewed.  Constitutional:      Appearance: Normal appearance.  Eyes:     Pupils: Pupils are equal, round, and reactive to light.  Cardiovascular:     Rate and Rhythm: Normal rate and regular rhythm.  Pulmonary:     Effort: Pulmonary effort is normal.  Abdominal:     Palpations: Abdomen is soft.     Tenderness: There is no abdominal tenderness.  Musculoskeletal:        General: Tenderness present. No swelling or signs of injury. Normal range of motion.     Left lower leg: No edema.  Skin:    General: Skin is warm and dry.     Capillary Refill: Capillary refill takes less than 2 seconds.     Findings: No bruising.  Neurological:     Mental Status: She is alert and oriented to person, place, and time.  Psychiatric:        Mood and Affect: Mood normal.        Behavior: Behavior normal.        Thought Content: Thought content normal.        Judgment: Judgment normal.    FHR: 150  MDM: Low MAU Course: Can weight bear on her ankle, no bruising, no swelling. Pt stable for discharge.  A Fall, initial encounter - Plan: Discharge patient  Presence of fetal heart sounds in second trimester  [redacted] weeks gestation of pregnancy  Medical screening exam complete  P Discharge from MAU in  stable condition with return precautions Follow up at [facility] as scheduled for ongoing prenatal care  Allergies as of 12/11/2022   No Known Allergies      Medication List     TAKE these medications    albuterol (2.5 MG/3ML) 0.083% nebulizer solution Commonly known as: PROVENTIL Take 3 mLs (2.5 mg total) by nebulization every 6 (six) hours as needed for wheezing or shortness of breath.   albuterol 108 (90 Base) MCG/ACT inhaler Commonly known as: VENTOLIN HFA Inhale 1-2 puffs into the lungs every 6 (six) hours as needed for wheezing or shortness of breath.   metoCLOPramide 10 MG tablet Commonly known as: Reglan Take 1 tablet (10 mg total) by mouth every 6 (six) hours as needed for nausea or vomiting.   Nebulizer System All-In-One Misc 1 application by Does not apply route every 4 (four) hours as needed. Use with nebulized albuterol.   polyethylene glycol powder 17 GM/SCOOP powder Commonly known as: MiraLax Take 17 g by mouth daily.   prenatal multivitamin Tabs tablet Take 1 tablet by mouth daily at 12 noon.   promethazine 25 MG tablet Commonly known as: PHENERGAN  Take 25 mg by mouth every 6 (six) hours as needed for nausea or vomiting.   pyridOXINE 25 MG tablet Commonly known as: VITAMIN B6 Take 25 mg by mouth daily.   scopolamine 1 MG/3DAYS Commonly known as: TRANSDERM-SCOP Place 1 patch (1.5 mg total) onto the skin every 3 (three) days.   senna-docusate 8.6-50 MG tablet Commonly known as: Senokot-S Take 1 tablet by mouth daily.   topiramate 25 MG tablet Commonly known as: TOPAMAX Take 1 tablet (25 mg total) by mouth 2 (two) times daily. For the first 3 to 5 days, take 1 tablet daily. Then increased to 1 tablet twice daily after that.       Bernerd Limbo, [place] 12/11/2022 8:34 PM

## 2022-12-11 NOTE — MAU Note (Signed)
Patricia [name] is a 22 y.o. at [redacted]w[redacted]d here in MAU reporting: she fell this morning @ 0430 and twisted left ankle.  Reports she's unsure if struck abdomen but braced fall with arms.  Denies VB or LOF.  Reports hasn't begun to feel fetal movement yet. LMP: NA Onset of complaint: today Pain score: 5 Vitals:   12/11/22 1155  BP: 122/64  Pulse: 83  Resp: 18  Temp: 98.1 F (36.7 C)  SpO2: 99%     FHT:150 bpm Lab orders placed from triage:   None

## 2022-12-23 ENCOUNTER — Ambulatory Visit: Payer: 59 | Admitting: Psychiatry

## 2022-12-23 NOTE — Progress Notes (Deleted)
   CC:  headaches  Follow-up Visit  Last visit: 05/14/22  Brief HPI: 22 year old female with a history of asthma who follows in clinic for migraines.  Interval History: *** Pregnant***  Headache days per month: *** Migraine days per month*** Headache free days per month: ***  Current Headache Regimen: Preventative: *** Abortive: ***   Prior Therapies                                  Flexeril 5 mg PRN Imitrex 50 mg PRN Topamax  Physical Exam:   Vital Signs: LMP 07/16/2022  GENERAL:  well appearing, in no acute distress, alert  SKIN:  Color, texture, turgor normal. No rashes or lesions HEAD:  Normocephalic/atraumatic. RESP: normal respiratory effort MSK:  No gross joint deformities.   NEUROLOGICAL: Mental Status: Alert, oriented to person, place and time, Follows commands, and Speech fluent and appropriate. Cranial Nerves: PERRL, face symmetric, no dysarthria, hearing grossly intact Motor: moves all extremities equally Gait: normal-based.  IMPRESSION: ***  PLAN: ***   Follow-up: ***  I spent a total of *** minutes on the date of the service. Headache education was done. Discussed lifestyle modification including increased oral hydration, decreased caffeine, exercise and stress management. Discussed treatment options including preventive and acute medications, natural supplements, and infusion therapy. Discussed medication overuse headache and to limit use of acute treatments to no more than 2 days/week or 10 days/month. Discussed medication side effects, adverse reactions and drug interactions. Written educational materials and patient instructions outlining all of the above were given.  Ocie Doyne, MD

## 2022-12-24 ENCOUNTER — Encounter: Payer: Self-pay | Admitting: Psychiatry

## 2022-12-24 ENCOUNTER — Ambulatory Visit (INDEPENDENT_AMBULATORY_CARE_PROVIDER_SITE_OTHER): Payer: 59 | Admitting: Psychiatry

## 2022-12-24 VITALS — BP 115/73 | HR 93 | Ht 64.0 in | Wt 203.0 lb

## 2022-12-24 DIAGNOSIS — G43009 Migraine without aura, not intractable, without status migrainosus: Secondary | ICD-10-CM | POA: Diagnosis not present

## 2022-12-24 NOTE — Progress Notes (Signed)
   CC:  headaches  Follow-up Visit  Last visit: 05/14/22  Brief HPI: 22 year old female with a history of asthma who follows in clinic for migraines.  At her last visit she was started on Imitrex for migraine rescue and Topamax for prevention.  Interval History: Topamax helped, but she had to stop her migraine medications as she is currently 6 months pregnant. Currently she is waking up with headaches most days and feeling like she is starving. Feels better after she eats. She tries to have snacks regularly throughout the day because headaches are worse when she misses a snack. She does not take anything as needed because Tylenol has not been effective. Takes phenergan for nausea.  Headache days per month: 30 Headache free days per month: 0  Current Headache Regimen: Preventative: none Abortive: none   Prior Therapies                                  Flexeril 5 mg PRN Imitrex 50 mg PRN Topamax  Physical Exam:   Vital Signs: LMP 07/16/2022  GENERAL:  well appearing, in no acute distress, alert  SKIN:  Color, texture, turgor normal. No rashes or lesions HEAD:  Normocephalic/atraumatic. RESP: normal respiratory effort  NEUROLOGICAL: Mental Status: Alert, oriented to person, place and time, Follows commands, and Speech fluent and appropriate. Cranial Nerves: PERRL, face symmetric, no dysarthria, hearing grossly intact Motor: moves all extremities equally Gait: normal-based.  IMPRESSION: 22 year old female with a history of asthma who presents for follow up of migraines. She has had to discontinue her migraine medications due to pregnancy and is currently having daily headaches. Discussed supplement options to help prevent migraines including magnesium and vitamin B2. Advised that taking phenergan and Benadryl with Tylenol for rescue may be more effective than Tylenol alone.  PLAN: -Preventive: Discussed supplement options including magnesium 400 mg daily and vitamin B2 200  mg BID -Rescue: Tylenol + benadryl + phenergan -Next steps: consider cyproheptadine if headaches persist (should be discontinued while breastfeeding)   Follow-up: 6 months  I spent a total of 23 minutes on the date of the service. Headache education was done. Discussed treatment options including supplements and acute medications. Discussed medication side effects, adverse reactions and drug interactions. Written educational materials and patient instructions outlining all of the above were given.  Ocie Doyne, MD 12/24/22 2:43 PM

## 2022-12-24 NOTE — Patient Instructions (Addendum)
MIGRAINE AND PREGNANCY  Migraine is a complex neurological disorder with many triggers, including sex hormones! This means that some women may experience changes in headache frequency when they are pregnant. In general, the increasing levels of estrogen during pregnancy can be protective, and women may see an improvement in migraine in the 2nd and 3rd trimester. After delivery, women typically return to the pre-pregnancy migraine pattern.   Treatment of migraine in pregnancy is tricky because the list of SAFE medications in pregnancy does not necessarily match the list of EFFECTIVE medications in headache.  The most difficult time is when a woman is planning pregnancy and it is unknown whether she is actually pregnant. Unfortunately, to avoid fetal exposure to potentially harmful medications, it is necessary to stop most migraine preventive medications and avoid the use of typical prescribed and over-the-counter as-needed medications previously used for headache.   ACUTE Treatment of Migraine in Pregnancy - Usual abortive treatments such as triptans and NSAIDS are Category C in pregnancy.  - Acetaminophen (Tylenol) up to 500 mg is less commonly used for migraine, however, it is considered the safest analgesic to use in pregnancy (category B), and it can be effective if used early in a migraine, especially in conjunction with caffeine and an anti-nausea medication such as promethazine (phenergan). Benadryl can also be used in combination with these medications.   Preventive Treatment of Migraine in Pregnancy Supplements: - Magnesium oxide in doses up to 400mg  per day is often considered first line in pregnancy as a migraine preventative (level B evidence of efficacy) and is thought to be safe. - Riboflavin B2 200 mg twice a day - Vitamin B6 may help with nausea when taken daily - Unisom (doxylamine) may help with sleep and morning sickness  Medications: - Cyproheptadine (brand name Periactin) is  an older anti-histamine that can be used (max dose 8mg /day) in prophylaxis and has not been shown to have adverse effects to the fetus. Side effects include fatigue and weight gain. This should not be used while breastfeeding.  - Labetalol and Verapamil are blood pressure medications which may reduce frequency of migraine in pregnancy. These medications cross the placenta and the baby should be monitored for low heart rate and respiratory depression.  Non-pharmacological Methods of Treatment - these are the safest interventions and can significantly redcue the frequency and severity of migraine - Nerve blocks (lidocaine only unless after 20 weeks) - Physical Therapy - Acupuncture - Behavioral Therapies  - Relaxation strategies, stress management, biofeedback, yoga  - Identification of triggers: The major migraine triggers include stress, hormones, diet (missed meals more than specific food triggers), weather, sleep changes, odors, neck pain, lighting/glare, neck pain, exercise/exertion, caffeine intake and dehydration. - Lifestyle changes: eating regular healthy meals, adequate sleep, staying well-hydrated with water (64 oz/day), and at least 30 min of exercise (walking is fine) per day

## 2023-01-15 ENCOUNTER — Inpatient Hospital Stay (HOSPITAL_COMMUNITY)
Admission: AD | Admit: 2023-01-15 | Discharge: 2023-01-15 | Disposition: A | Discharge status: Home / Self Care | Payer: 59 | Attending: Obstetrics and Gynecology | Admitting: Obstetrics and Gynecology

## 2023-01-15 ENCOUNTER — Encounter (HOSPITAL_COMMUNITY): Payer: Self-pay | Admitting: Obstetrics and Gynecology

## 2023-01-15 DIAGNOSIS — Z3689 Encounter for other specified antenatal screening: Secondary | ICD-10-CM | POA: Insufficient documentation

## 2023-01-15 DIAGNOSIS — O368120 Decreased fetal movements, second trimester, not applicable or unspecified: Secondary | ICD-10-CM | POA: Diagnosis present

## 2023-01-15 DIAGNOSIS — Z3A26 26 weeks gestation of pregnancy: Secondary | ICD-10-CM | POA: Insufficient documentation

## 2023-01-15 LAB — URINALYSIS, ROUTINE W REFLEX MICROSCOPIC
Bilirubin Urine: NEGATIVE
Glucose, UA: NEGATIVE mg/dL
Hgb urine dipstick: NEGATIVE
Ketones, ur: 5 mg/dL — AB
Leukocytes,Ua: NEGATIVE
Nitrite: NEGATIVE
Protein, ur: 30 mg/dL — AB
Specific Gravity, Urine: 1.029 (ref 1.005–1.030)
pH: 5 (ref 5.0–8.0)

## 2023-01-15 NOTE — MAU Note (Signed)
Pt says she was asleep x4 hrs- awoke at 6pm- and only has felt baby move x1 since  In Triage - [facility]- 155 At 610pm- Pt ate spicey  chicken - she moved.  [facility]- last visit- 12-25-2022. Next appointment - 10-24 Last sex- Tuesday  No UC's

## 2023-01-15 NOTE — MAU Provider Note (Signed)
Chief Complaint: Decreased Fetal Movement  Provider seen 2034    SUBJECTIVE HPI: Patricia [name] is a 22 y.o. G2P0010 at [redacted]w[redacted]d by LMP who presents to maternity admissions reporting DFM.  Patient took a nap today and woke up and only felt one movement, since she woke up. Did eat some food and felt some more movement. She denies urinary symptoms, abdominal pain, cramping, VB, change in vaginal discharge, fever/chills.  Has had an uncomplicated pregnancy so far. Receives care at [facility]. UTD w/[facility]; records reviewed.  HPI  Past Medical History:  Diagnosis Date   Asthma    Past Surgical History:  Procedure Laterality Date   TONSILLECTOMY     TYMPANOSTOMY TUBE PLACEMENT     Social History   Socioeconomic History   Marital status: Single    Spouse name: Not on file   Number of children: Not on file   Years of education: Not on file   Highest education level: Not on file  Occupational History   Not on file  Tobacco Use   Smoking status: Former    Types: Cigars    Passive exposure: Current   Smokeless tobacco: Never   Tobacco comments:    Black and mild every day  Vaping Use   Vaping status: Never Used  Substance and Sexual Activity   Alcohol use: Not Currently    Comment: occ.   Drug use: Yes    Frequency: 7.0 times per week    Types: Marijuana    Comment: last used 4 days ago as of 10/18   Sexual activity: Yes    Partners: Male    Birth control/protection: None  Other Topics Concern   Not on file  Social History Narrative   Not on file   Social Determinants of Health   Financial Resource Strain: Not on file  Food Insecurity: Not on file  Transportation Needs: Not on file  Physical Activity: Not on file  Stress: Not on file  Social Connections: Not on file  Intimate Partner Violence: Not on file   No current facility-administered medications on file prior to encounter.   Current Outpatient Medications on File Prior to Encounter  Medication Sig  Dispense Refill   albuterol (PROVENTIL) (2.5 MG/3ML) 0.083% nebulizer solution Take 3 mLs (2.5 mg total) by nebulization every 6 (six) hours as needed for wheezing or shortness of breath. 75 mL 12   albuterol (VENTOLIN HFA) 108 (90 Base) MCG/ACT inhaler Inhale 1-2 puffs into the lungs every 6 (six) hours as needed for wheezing or shortness of breath. 18 g 0   [DISCONTINUED] loratadine (CLARITIN) 10 MG tablet Take 1 tablet (10 mg total) by mouth daily. (Patient not taking: No sig reported) 30 tablet 1   No Known Allergies  ROS:  Pertinent positives/negatives listed above.  I have reviewed patient's Past Medical Hx, Surgical Hx, Family Hx, Social Hx, medications and allergies.   Physical Exam  Patient Vitals for the past 24 hrs:  BP Temp Temp src Pulse Resp Height Weight  01/15/23 2055 129/71 -- -- 83 -- -- --  01/15/23 1956 118/65 98 F (36.7 C) Oral 89 16 5\' 4"  (1.626 m) 93.4 kg   Constitutional: Well-developed, well-nourished female in no acute distress Cardiovascular: normal rate Respiratory: normal effort GI: Abd soft, non-tender. Pos BS x 4 MS: Extremities nontender, no edema, normal ROM Neurologic: Alert and oriented x4    FHT:  Baseline 150, moderate variability, accelerations present 10x10, one shallow variable on 30 minutes of monitoring  Contractions: none  LAB RESULTS Results for orders placed or performed during the hospital encounter of 01/15/23 (from the past 24 hour(s))  Urinalysis, Routine w reflex microscopic -Urine, Clean Catch     Status: Abnormal   Collection Time: 01/15/23  7:59 PM  Result Value Ref Range   Color, Urine YELLOW YELLOW   APPearance HAZY (A) CLEAR   Specific Gravity, Urine 1.029 1.005 - 1.030   pH 5.0 5.0 - 8.0   Glucose, UA NEGATIVE NEGATIVE mg/dL   Hgb urine dipstick NEGATIVE NEGATIVE   Bilirubin Urine NEGATIVE NEGATIVE   Ketones, ur 5 (A) NEGATIVE mg/dL   Protein, ur 30 (A) NEGATIVE mg/dL   Nitrite NEGATIVE NEGATIVE   Leukocytes,Ua  NEGATIVE NEGATIVE   RBC / HPF 0-5 0 - 5 RBC/hpf   WBC, UA 0-5 0 - 5 WBC/hpf   Bacteria, UA RARE (A) NONE SEEN   Squamous Epithelial / HPF 6-10 0 - 5 /HPF   Mucus PRESENT       IMAGING No results found.  MAU Management/MDM: Orders Placed This Encounter  Procedures   Urinalysis, Routine w reflex microscopic -Urine, Clean Catch   Discharge patient    No orders of the defined types were placed in this encounter.   Patient presents with DFM. Movement picked up after eating and upon arrival to MAU. Ultrasound used to find FHT. Babe with excellent movement, and reactive tracing for gestational age for 30+ minutes. Discussed ways to increase fetal movement such as eating, drinking, chewing ice, etc. Return precautions given. Has follow-up appropriately scheduled.  ASSESSMENT 1. Decreased fetal movements in second trimester, single or unspecified fetus   2. NST (non-stress test) reactive   3. [redacted] weeks gestation of pregnancy     PLAN Discharge home with strict return precautions. Allergies as of 01/15/2023   No Known Allergies      Medication List     TAKE these medications    albuterol (2.5 MG/3ML) 0.083% nebulizer solution Commonly known as: PROVENTIL Take 3 mLs (2.5 mg total) by nebulization every 6 (six) hours as needed for wheezing or shortness of breath.   albuterol 108 (90 Base) MCG/ACT inhaler Commonly known as: VENTOLIN HFA Inhale 1-2 puffs into the lungs every 6 (six) hours as needed for wheezing or shortness of breath.         Wylene Simmer, MD OB Fellow 01/15/2023  8:59 PM

## 2023-02-09 ENCOUNTER — Inpatient Hospital Stay (HOSPITAL_COMMUNITY)
Admission: AD | Admit: 2023-02-09 | Discharge: 2023-02-09 | Disposition: A | Discharge status: Home / Self Care | Payer: 59 | Attending: Obstetrics and Gynecology | Admitting: Obstetrics and Gynecology

## 2023-02-09 DIAGNOSIS — O99513 Diseases of the respiratory system complicating pregnancy, third trimester: Secondary | ICD-10-CM | POA: Insufficient documentation

## 2023-02-09 DIAGNOSIS — O26893 Other specified pregnancy related conditions, third trimester: Secondary | ICD-10-CM | POA: Insufficient documentation

## 2023-02-09 DIAGNOSIS — R059 Cough, unspecified: Secondary | ICD-10-CM | POA: Diagnosis not present

## 2023-02-09 DIAGNOSIS — Z1152 Encounter for screening for COVID-19: Secondary | ICD-10-CM | POA: Insufficient documentation

## 2023-02-09 DIAGNOSIS — J00 Acute nasopharyngitis [common cold]: Secondary | ICD-10-CM | POA: Diagnosis not present

## 2023-02-09 DIAGNOSIS — Z3A29 29 weeks gestation of pregnancy: Secondary | ICD-10-CM | POA: Diagnosis not present

## 2023-02-09 LAB — RESPIRATORY PANEL BY PCR

## 2023-02-09 LAB — SARS CORONAVIRUS 2 BY RT PCR: SARS Coronavirus 2 by RT PCR: NEGATIVE

## 2023-02-09 NOTE — MAU Provider Note (Addendum)
Chief Complaint:  Sore Throat, Cough, and Nasal Congestion  HPI  HPI: Patricia [name] is a 22 y.o. G2P0010 at 70w5dwho presents to maternity admissions reporting runny nose, sore throat, and cough. Denis shortness of breath, fever/chills, chest pain, or dysphagia.  She reports good fetal movement, denies LOF, vaginal bleeding, vaginal itching/burning, urinary symptoms, h/a, dizziness, n/v, diarrhea, constipation or fever/chills.  She denies headache, visual changes or RUQ abdominal pain.   Past Medical History: Past Medical History:  Diagnosis Date   Asthma     Past obstetric history: OB History  Gravida Para Term Preterm AB Living  2       1    SAB IAB Ectopic Multiple Live Births  1            # Outcome Date GA Lbr Len/2nd Weight Sex Type Anes PTL Lv  2 Current           1 SAB 11/2021     SAB       Past Surgical History: Past Surgical History:  Procedure Laterality Date   TONSILLECTOMY     TYMPANOSTOMY TUBE PLACEMENT      Family History: Family History  Problem Relation Age of Onset   Kidney Stones Mother    Asthma Father     Social History: Social History   Tobacco Use   Smoking status: Former    Types: Cigars    Passive exposure: Current   Smokeless tobacco: Never   Tobacco comments:    Black and mild every day  Vaping Use   Vaping status: Never Used  Substance Use Topics   Alcohol use: Not Currently    Comment: occ.   Drug use: Yes    Frequency: 7.0 times per week    Types: Marijuana    Comment: last used 4 days ago as of 10/18    Allergies: No Known Allergies  Meds:  Medications Prior to Admission  Medication Sig Dispense Refill Last Dose   albuterol (VENTOLIN HFA) 108 (90 Base) MCG/ACT inhaler Inhale 1-2 puffs into the lungs every 6 (six) hours as needed for wheezing or shortness of breath. 18 g 0 02/09/2023   albuterol (PROVENTIL) (2.5 MG/3ML) 0.083% nebulizer solution Take 3 mLs (2.5 mg total) by nebulization every 6 (six) hours as needed  for wheezing or shortness of breath. 75 mL 12     I have reviewed patient's Past Medical Hx, Surgical Hx, Family Hx, Social Hx, medications and allergies.   ROS:  Review of Systems Other systems negative  Physical Exam  Patient Vitals for the past 24 hrs:  BP Temp Temp src Pulse Resp Height Weight  02/09/23 0254 130/66 98.2 F (36.8 C) Oral 98 16 5\' 4"  (1.626 m) 93.9 kg   Constitutional: Well-developed, well-nourished female in no acute distress.  Cardiovascular: normal rate and rhythm Respiratory: normal effort, clear to auscultation bilaterally GI: Abd soft, non-tender, gravid appropriate for gestational age.   No rebound or guarding. MS: Extremities nontender, no edema, normal ROM Neurologic: Alert and oriented x 4.  GU: Neg CVAT.  FHT: 146 by doppler   Labs: No results found for this or any previous visit (from the past 24 hour(s)).    Imaging:  No results found.  MAU Course/MDM: I have reviewed the triage vital signs and the nursing notes.   Pertinent labs & imaging results that were available during my care of the patient were reviewed by me and considered in my medical decision making (see chart for details).  I have reviewed her medical records including past results, notes and treatments.   I have ordered labs and reviewed results.   Treatments in MAU included list of safe medications in pregnancy, fetal heart tones by doppler.    Assessment: 1. [redacted] weeks gestation of pregnancy   2. Common cold   Nasal congrestion, mild sore thorat without fever or airway compromise Offered respiratory panel swab - declined Request work note for today  Discussed safe medications in pregnancy for treatment of cold - provided handout.   Plan: Discharge home Labor precautions and fetal kick counts Follow up in Office for prenatal visits and recheck  Follow-up Information     Gynecology, [facility] Follow up.   Specialty: Obstetrics and Gynecology Why: As  scheduled for prenatal care Contact information: [address] [place] [address] [phone]                 Pt stable at time of discharge.  Wyn Forster, MD FMOB Fellow, Faculty practice [facility], Center for [facility]  02/09/2023 3:14 AM

## 2023-02-09 NOTE — MAU Note (Signed)
Pt says she has runny nose , sore throat , cough. She has taken Benadryl. She has list of meds - safe in preg.  No UC's

## 2023-02-09 NOTE — Discharge Instructions (Signed)

## 2023-02-11 ENCOUNTER — Encounter (HOSPITAL_COMMUNITY): Payer: Self-pay | Admitting: Obstetrics & Gynecology

## 2023-02-11 ENCOUNTER — Inpatient Hospital Stay (HOSPITAL_COMMUNITY)
Admission: AD | Admit: 2023-02-11 | Discharge: 2023-02-11 | Disposition: A | Discharge status: Home / Self Care | Payer: 59 | Attending: Obstetrics & Gynecology | Admitting: Obstetrics & Gynecology

## 2023-02-11 ENCOUNTER — Other Ambulatory Visit: Payer: Self-pay

## 2023-02-11 DIAGNOSIS — R102 Pelvic and perineal pain: Secondary | ICD-10-CM | POA: Diagnosis not present

## 2023-02-11 DIAGNOSIS — O26893 Other specified pregnancy related conditions, third trimester: Secondary | ICD-10-CM | POA: Diagnosis present

## 2023-02-11 DIAGNOSIS — O26899 Other specified pregnancy related conditions, unspecified trimester: Secondary | ICD-10-CM

## 2023-02-11 DIAGNOSIS — Z3A30 30 weeks gestation of pregnancy: Secondary | ICD-10-CM | POA: Diagnosis not present

## 2023-02-11 DIAGNOSIS — Z630 Problems in relationship with spouse or partner: Secondary | ICD-10-CM

## 2023-02-11 LAB — CULTURE, GROUP A STREP (THRC)
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
## 2023-03-08 ENCOUNTER — Encounter (HOSPITAL_COMMUNITY): Payer: Self-pay | Admitting: Family Medicine

## 2023-03-08 ENCOUNTER — Other Ambulatory Visit: Payer: Self-pay

## 2023-03-08 ENCOUNTER — Inpatient Hospital Stay (HOSPITAL_COMMUNITY)
Admission: AD | Admit: 2023-03-08 | Discharge: 2023-03-08 | Disposition: A | Discharge status: Home / Self Care | Payer: 59 | Attending: Family Medicine | Admitting: Family Medicine

## 2023-03-08 DIAGNOSIS — R109 Unspecified abdominal pain: Secondary | ICD-10-CM | POA: Diagnosis present

## 2023-03-08 DIAGNOSIS — W109XXA Fall (on) (from) unspecified stairs and steps, initial encounter: Secondary | ICD-10-CM | POA: Insufficient documentation

## 2023-03-08 DIAGNOSIS — Z3A33 33 weeks gestation of pregnancy: Secondary | ICD-10-CM | POA: Insufficient documentation

## 2023-03-08 DIAGNOSIS — O26893 Other specified pregnancy related conditions, third trimester: Secondary | ICD-10-CM | POA: Insufficient documentation

## 2023-03-08 DIAGNOSIS — O23593 Infection of other part of genital tract in pregnancy, third trimester: Secondary | ICD-10-CM | POA: Diagnosis not present

## 2023-03-08 DIAGNOSIS — O9A219 Injury, poisoning and certain other consequences of external causes complicating pregnancy, unspecified trimester: Secondary | ICD-10-CM | POA: Diagnosis not present

## 2023-03-08 DIAGNOSIS — R3989 Other symptoms and signs involving the genitourinary system: Secondary | ICD-10-CM

## 2023-03-08 LAB — CBC WITH DIFFERENTIAL/PLATELET
Abs Immature Granulocytes: 0.14 10*3/uL — ABNORMAL HIGH (ref 0.00–0.07)
Basophils Absolute: 0 10*3/uL (ref 0.0–0.1)
Basophils Relative: 0 %
Eosinophils Absolute: 0 10*3/uL (ref 0.0–0.5)
Eosinophils Relative: 0 %
HCT: 38.1 % (ref 36.0–46.0)
Hemoglobin: 12.7 g/dL (ref 12.0–15.0)
Immature Granulocytes: 1 %
Lymphocytes Relative: 13 %
Lymphs Abs: 1.9 10*3/uL (ref 0.7–4.0)
MCH: 28.2 pg (ref 26.0–34.0)
MCHC: 33.3 g/dL (ref 30.0–36.0)
MCV: 84.5 fL (ref 80.0–100.0)
Monocytes Absolute: 1 10*3/uL (ref 0.1–1.0)
Monocytes Relative: 7 %
Neutro Abs: 11.1 10*3/uL — ABNORMAL HIGH (ref 1.7–7.7)
Neutrophils Relative %: 79 %
Platelets: 183 10*3/uL (ref 150–400)
RBC: 4.51 MIL/uL (ref 3.87–5.11)
RDW: 14.3 % (ref 11.5–15.5)
WBC: 14.1 10*3/uL — ABNORMAL HIGH (ref 4.0–10.5)
nRBC: 0 % (ref 0.0–0.2)

## 2023-03-08 LAB — URINALYSIS, ROUTINE W REFLEX MICROSCOPIC
Bilirubin Urine: NEGATIVE
Glucose, UA: NEGATIVE mg/dL
Hgb urine dipstick: NEGATIVE
Ketones, ur: 80 mg/dL — AB
Nitrite: NEGATIVE
Protein, ur: 100 mg/dL — AB
Specific Gravity, Urine: 1.028 (ref 1.005–1.030)
pH: 6 (ref 5.0–8.0)

## 2023-03-08 MED ORDER — ACETAMINOPHEN 325 MG PO TABS: 650.0000 mg | ORAL_TABLET | Freq: Once | ORAL | Status: AC

## 2023-03-08 MED ORDER — TERCONAZOLE 0.8 % VA CREA: 1.0000 | TOPICAL_CREAM | Freq: Every day | VAGINAL | 0 refills | Status: DC

## 2023-03-08 MED ORDER — ACETAMINOPHEN 325 MG PO TABS
650.0000 mg | ORAL_TABLET | Freq: Once | ORAL | Status: AC
  Administered 2023-03-08: 650 mg via ORAL
  Filled 2023-03-08: qty 2
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
## 2023-03-08 NOTE — MAU Provider Note (Addendum)
History     CSN: [id number]  Arrival date and time: 03/08/23 1630   None     Chief Complaint  Patient presents with   Patricia [name]   Patricia [name] is a 22 y.o. G2P0010 at [redacted]w[redacted]d who receives care at [facility].  She presents today after a fall at home around 3:30pm today. Patient states she fell while walking down the stairs onto the right side of her abdomen. States she has felt one fetal movement since the fall. Rates her abdominal pain as a 5/10. Has not taken any medication for this. Denies vaginal bleeding and increased vaginal discharge. Endorses some burning with urination and a mild headache. States she has not eaten much today.   Of note, pt was diagnosed with a yeast infection and has not yet been able to pick up her prescription.  OB History     Gravida  2   Para      Term      Preterm      AB  1   Living         SAB  1   IAB      Ectopic      Multiple      Live Births              Past Medical History:  Diagnosis Date   Asthma     Past Surgical History:  Procedure Laterality Date   TONSILLECTOMY     TYMPANOSTOMY TUBE PLACEMENT      Family History  Problem Relation Age of Onset   Kidney Stones Mother    Asthma Father     Social History   Tobacco Use   Smoking status: Former    Types: Cigars    Passive exposure: Current   Smokeless tobacco: Never   Tobacco comments:    Black and mild every day  Vaping Use   Vaping status: Never Used  Substance Use Topics   Alcohol use: Not Currently    Comment: occ.   Drug use: Not Currently    Frequency: 7.0 times per week    Types: Marijuana    Comment: last used 4 days ago as of 10/18    Allergies: No Known Allergies  Medications Prior to Admission  Medication Sig Dispense Refill Last Dose   albuterol (PROVENTIL) (2.5 MG/3ML) 0.083% nebulizer solution Take 3 mLs (2.5 mg total) by nebulization every 6 (six) hours as needed for wheezing or shortness of breath. 75 mL 12    albuterol (VENTOLIN  HFA) 108 (90 Base) MCG/ACT inhaler Inhale 1-2 puffs into the lungs every 6 (six) hours as needed for wheezing or shortness of breath. 18 g 0     Review of Systems  Eyes:  Negative for visual disturbance.  Gastrointestinal:  Positive for abdominal pain. Negative for constipation, diarrhea, nausea and vomiting.  Genitourinary:  Positive for dysuria. Negative for hematuria, vaginal bleeding and vaginal discharge.  Musculoskeletal:  Negative for back pain.  Neurological:  Positive for headaches.   Physical Exam   Blood pressure 125/66, pulse 100, temperature 98.5 F (36.9 C), temperature source Oral, resp. rate 20, height 5\' 4"  (1.626 m), weight 93.5 kg, last menstrual period 07/16/2022, SpO2 100%.  Physical Exam Constitutional:      Appearance: Normal appearance.  Cardiovascular:     Rate and Rhythm: Normal rate and regular rhythm.  Pulmonary:     Effort: Pulmonary effort is normal.     Breath sounds: Normal breath sounds.  Abdominal:  Palpations: Abdomen is soft.     Tenderness: There is abdominal tenderness. There is no guarding or rebound.     Comments: Mild tenderness to RLQ  Skin:    General: Skin is warm and dry.  Neurological:     General: No focal deficit present.     Mental Status: She is alert.  Psychiatric:        Mood and Affect: Mood normal.        Behavior: Behavior normal.     Fetal Assessment 140 bpm, Mod Var, -Decels, +Accels Toco: no contractions SSE: minimal discharge noted, cervix appears closed SVE: closed/thick/high  MAU Course   Results for orders placed or performed during the hospital encounter of 03/08/23 (from the past 24 hour(s))  Urinalysis, Routine w reflex microscopic -Urine, Clean Catch     Status: Abnormal   Collection Time: 03/08/23  5:56 PM  Result Value Ref Range   Color, Urine AMBER (A) YELLOW   APPearance CLOUDY (A) CLEAR   Specific Gravity, Urine 1.028 1.005 - 1.030   pH 6.0 5.0 - 8.0   Glucose, UA NEGATIVE NEGATIVE mg/dL    Hgb urine dipstick NEGATIVE NEGATIVE   Bilirubin Urine NEGATIVE NEGATIVE   Ketones, ur 80 (A) NEGATIVE mg/dL   Protein, ur 409 (A) NEGATIVE mg/dL   Nitrite NEGATIVE NEGATIVE   Leukocytes,Ua MODERATE (A) NEGATIVE   RBC / HPF 0-5 0 - 5 RBC/hpf   WBC, UA 6-10 0 - 5 WBC/hpf   Bacteria, UA FEW (A) NONE SEEN   Squamous Epithelial / HPF 21-50 0 - 5 /HPF   Mucus PRESENT   CBC with Differential/Platelet     Status: Abnormal   Collection Time: 03/08/23  6:25 PM  Result Value Ref Range   WBC 14.1 (H) 4.0 - 10.5 K/uL   RBC 4.51 3.87 - 5.11 MIL/uL   Hemoglobin 12.7 12.0 - 15.0 g/dL   HCT 81.1 91.4 - 78.2 %   MCV 84.5 80.0 - 100.0 fL   MCH 28.2 26.0 - 34.0 pg   MCHC 33.3 30.0 - 36.0 g/dL   RDW 95.6 21.3 - 08.6 %   Platelets 183 150 - 400 K/uL   nRBC 0.0 0.0 - 0.2 %   Neutrophils Relative % 79 %   Neutro Abs 11.1 (H) 1.7 - 7.7 K/uL   Lymphocytes Relative 13 %   Lymphs Abs 1.9 0.7 - 4.0 K/uL   Monocytes Relative 7 %   Monocytes Absolute 1.0 0.1 - 1.0 K/uL   Eosinophils Relative 0 %   Eosinophils Absolute 0.0 0.0 - 0.5 K/uL   Basophils Relative 0 %   Basophils Absolute 0.0 0.0 - 0.1 K/uL   Immature Granulocytes 1 %   Abs Immature Granulocytes 0.14 (H) 0.00 - 0.07 K/uL   No results found.  MDM PE Labs: CBC w diff, UA, Ucx Po hydration and tylenol given  Assessment and Plan  22 G2P0010 SIUP at [redacted]w[redacted]d  Mechanical fall Abdominal pain Vaginitis  -pain improved -FHT reassuring, Cat. I tracing -resent in treatment for yeast -will follow urine culture results -discharge home in stable condition -follow up with primary OB as scheduled   Lorayne Bender PGY-1, Family Medicine 03/08/2023, 8:08 PM

## 2023-03-09 LAB — CULTURE, OB URINE: Culture: NO GROWTH

## 2023-03-19 ENCOUNTER — Inpatient Hospital Stay (HOSPITAL_COMMUNITY)
Admission: AD | Admit: 2023-03-19 | Discharge: 2023-03-20 | Disposition: A | Discharge status: Home / Self Care | Payer: 59 | Attending: Obstetrics and Gynecology | Admitting: Obstetrics and Gynecology

## 2023-03-19 DIAGNOSIS — N898 Other specified noninflammatory disorders of vagina: Secondary | ICD-10-CM

## 2023-03-19 DIAGNOSIS — Z3689 Encounter for other specified antenatal screening: Secondary | ICD-10-CM

## 2023-03-19 DIAGNOSIS — Z3A35 35 weeks gestation of pregnancy: Secondary | ICD-10-CM | POA: Insufficient documentation

## 2023-03-19 DIAGNOSIS — Z87891 Personal history of nicotine dependence: Secondary | ICD-10-CM | POA: Insufficient documentation

## 2023-03-19 DIAGNOSIS — O4693 Antepartum hemorrhage, unspecified, third trimester: Secondary | ICD-10-CM | POA: Insufficient documentation

## 2023-03-19 NOTE — MAU Note (Signed)
Pt says she using applicator for yeast infection - she saw blood on applicator  and tissue . In triage - pt touched perineum - saw light pink. Last movement - 8pm No UC's Next appointment - on Monday  Last sex- 2 days ago

## 2023-03-20 ENCOUNTER — Encounter (HOSPITAL_COMMUNITY): Payer: Self-pay | Admitting: Obstetrics and Gynecology

## 2023-03-20 DIAGNOSIS — O4693 Antepartum hemorrhage, unspecified, third trimester: Secondary | ICD-10-CM | POA: Diagnosis present

## 2023-03-20 DIAGNOSIS — Z3A35 35 weeks gestation of pregnancy: Secondary | ICD-10-CM | POA: Diagnosis not present

## 2023-03-20 DIAGNOSIS — Z87891 Personal history of nicotine dependence: Secondary | ICD-10-CM | POA: Diagnosis not present

## 2023-03-20 NOTE — MAU Provider Note (Signed)
History     CSN: [id number]  Arrival date and time: 03/19/23 2200   Event Date/Time   First Provider Initiated Contact with Patient 03/20/23 0112      Chief Complaint  Patient presents with   Vaginal Bleeding   Patricia [name] is a 22 y.o. G2P0010 at [redacted]w[redacted]d who receives care at [facility].  She reports her next appt is Monday.  Patient presents today for vaginal bleeding.  She reports she was inserting applicator for yeast treatment into her vagina and noted blood on the applicator.  She states she also noted some blood on the tissue with wiping.  She does report it was light red.  She reports some intermittent abdominal cramping and questions if she is experiencing some gas/gi distress.  She states she has not noted any vaginal bleeding since being in the lobby.   OB History     Gravida  2   Para      Term      Preterm      AB  1   Living         SAB  1   IAB      Ectopic      Multiple      Live Births              Past Medical History:  Diagnosis Date   Asthma     Past Surgical History:  Procedure Laterality Date   TONSILLECTOMY     TYMPANOSTOMY TUBE PLACEMENT      Family History  Problem Relation Age of Onset   Kidney Stones Mother    Asthma Father     Social History   Tobacco Use   Smoking status: Former    Types: Cigars    Passive exposure: Current   Smokeless tobacco: Never   Tobacco comments:    Black and mild every day  Vaping Use   Vaping status: Never Used  Substance Use Topics   Alcohol use: Not Currently    Comment: occ.   Drug use: Not Currently    Frequency: 7.0 times per week    Types: Marijuana    Comment: last used 4 days ago as of 10/18    Allergies: No Known Allergies  Medications Prior to Admission  Medication Sig Dispense Refill Last Dose/Taking   terconazole (TERAZOL 3) 0.8 % vaginal cream Place 1 applicator vaginally at bedtime. 20 g 0 03/19/2023 Evening   albuterol (PROVENTIL) (2.5 MG/3ML) 0.083%  nebulizer solution Take 3 mLs (2.5 mg total) by nebulization every 6 (six) hours as needed for wheezing or shortness of breath. 75 mL 12    albuterol (VENTOLIN HFA) 108 (90 Base) MCG/ACT inhaler Inhale 1-2 puffs into the lungs every 6 (six) hours as needed for wheezing or shortness of breath. 18 g 0     Review of Systems  Gastrointestinal:  Positive for diarrhea. Negative for abdominal pain and constipation.  Genitourinary:  Positive for vaginal bleeding and vaginal discharge. Negative for difficulty urinating and dysuria.   Physical Exam   Blood pressure 126/71, pulse (!) 101, temperature 98.1 F (36.7 C), temperature source Oral, resp. rate 16, height 5\' 4"  (1.626 m), weight 97 kg, last menstrual period 07/16/2022.  Physical Exam Vitals and nursing note reviewed.  Constitutional:      Appearance: Normal appearance.  HENT:     Head: Normocephalic and atraumatic.  Eyes:     Conjunctiva/sclera: Conjunctivae normal.  Cardiovascular:     Rate and Rhythm:  Tachycardia present.  Pulmonary:     Effort: Pulmonary effort is normal. No respiratory distress.  Abdominal:     Palpations: Abdomen is soft.     Tenderness: There is no abdominal tenderness.     Comments: Gravid, Appears SGA  Genitourinary:    General: Normal vulva.     Comments: Speculum Exam: -Normal External Genitalia: Non tender. Milky white discharge at introitus.  -Vaginal Vault: Pink mucosa with good rugae.Appears irritated. No blood or active bleeding noted.  Moderate amt thick white curdy discharge - -Cervix:Pink, no lesions, cysts, or polyps.  Appears closed. No active bleeding or discharge from os-GC/CT collected -Bimanual Exam:  Deferred Musculoskeletal:        General: Normal range of motion.     Cervical back: Normal range of motion.  Skin:    General: Skin is warm.  Neurological:     Mental Status: She is alert and oriented to person, place, and time.  Psychiatric:        Mood and Affect: Mood normal.         Behavior: Behavior normal.     Fetal Assessment 140 bpm, Mod Var, -Decels, +15x15 Accels Toco: No ctx graphed  MAU Course  No results found for this or any previous visit (from the past 24 hours). No results found.  MDM PE Labs: None EFM  Assessment and Plan  22 year old G2P0010  SIUP at 35.2 weeks Cat I FT Vaginal Bleeding-Resolved  -POC Reviewed. -Discussed pelvic exam and patient agreeable. -Exam performed and findings discussed. -Reassured that no blood noted in vault. -Discussed how spotting may be due to irritation associated with known yeast infection. -Encouraged to monitor and report any symptoms.   -Instructed to continue yeast treatment for a total of 3 nights.  -NST reassuring, but not yet reactive. -Continue to monitor and plan for discharge with reactive NST.   Cherre Robins MSN, CNM 03/20/2023, 1:12 AM  Reassessment (2:11 AM) -NST reactive. -Discharge to home with precautions.  Cherre Robins MSN, CNM Advanced Practice Provider, [facility] [employer] [facility]

## 2023-03-22 LAB — OB RESULTS CONSOLE GBS: GBS: NEGATIVE

## 2023-03-31 NOTE — L&D Delivery Note (Signed)
Delivery Note At 3:36 PM a viable female was delivered via Vaginal, Spontaneous (Presentation: Left Occiput Anterior).  APGAR: 8, 9; weight  .   Placenta status: Spontaneous, Intact.  Cord: 3 vessels with the following complications: None.  Cord pH: 7.15  Anesthesia: Epidural Episiotomy: Right medial lateral Lacerations:  none Suture Repair: 3.0 vicryl Est. Blood Loss (mL): 300  Mom to postpartum.  Baby to Couplet care / Skin to Skin.  Gerald Leitz 04/15/2023, 5:07 PM

## 2023-04-13 DIAGNOSIS — R030 Elevated blood-pressure reading, without diagnosis of hypertension: Secondary | ICD-10-CM | POA: Diagnosis not present

## 2023-04-13 DIAGNOSIS — Z3483 Encounter for supervision of other normal pregnancy, third trimester: Secondary | ICD-10-CM | POA: Diagnosis not present

## 2023-04-14 ENCOUNTER — Other Ambulatory Visit: Payer: Self-pay | Admitting: Family Medicine

## 2023-04-14 ENCOUNTER — Inpatient Hospital Stay (HOSPITAL_COMMUNITY)
Admission: AD | Admit: 2023-04-14 | Discharge: 2023-04-17 | DRG: 807 | Disposition: A | Discharge status: Home / Self Care | Payer: 59 | Attending: Obstetrics and Gynecology | Admitting: Obstetrics and Gynecology

## 2023-04-14 ENCOUNTER — Other Ambulatory Visit: Payer: Self-pay

## 2023-04-14 DIAGNOSIS — O134 Gestational [pregnancy-induced] hypertension without significant proteinuria, complicating childbirth: Principal | ICD-10-CM | POA: Diagnosis present

## 2023-04-14 DIAGNOSIS — O139 Gestational [pregnancy-induced] hypertension without significant proteinuria, unspecified trimester: Secondary | ICD-10-CM | POA: Insufficient documentation

## 2023-04-14 DIAGNOSIS — Z87891 Personal history of nicotine dependence: Secondary | ICD-10-CM | POA: Diagnosis not present

## 2023-04-14 DIAGNOSIS — Z3A38 38 weeks gestation of pregnancy: Secondary | ICD-10-CM | POA: Diagnosis not present

## 2023-04-14 LAB — CBC
HCT: 37.9 % (ref 36.0–46.0)
Hemoglobin: 12.8 g/dL (ref 12.0–15.0)
MCH: 28.1 pg (ref 26.0–34.0)
MCHC: 33.8 g/dL (ref 30.0–36.0)
MCV: 83.1 fL (ref 80.0–100.0)
Platelets: 162 10*3/uL (ref 150–400)
RBC: 4.56 MIL/uL (ref 3.87–5.11)
RDW: 15.2 % (ref 11.5–15.5)
WBC: 16.1 10*3/uL — ABNORMAL HIGH (ref 4.0–10.5)
nRBC: 0 % (ref 0.0–0.2)

## 2023-04-14 LAB — TYPE AND SCREEN
ABO/RH(D): B POS
Antibody Screen: NEGATIVE

## 2023-04-14 LAB — COMPREHENSIVE METABOLIC PANEL
ALT: 17 U/L (ref 0–44)
AST: 16 U/L (ref 15–41)
Albumin: 2.6 g/dL — ABNORMAL LOW (ref 3.5–5.0)
Alkaline Phosphatase: 179 U/L — ABNORMAL HIGH (ref 38–126)
Anion gap: 8 (ref 5–15)
BUN: 7 mg/dL (ref 6–20)
CO2: 19 mmol/L — ABNORMAL LOW (ref 22–32)
Calcium: 8.8 mg/dL — ABNORMAL LOW (ref 8.9–10.3)
Chloride: 107 mmol/L (ref 98–111)
Creatinine, Ser: 0.6 mg/dL (ref 0.44–1.00)
GFR, Estimated: >60 mL/min (ref >60)
Glucose, Bld: 85 mg/dL (ref 70–99)
Potassium: 3.9 mmol/L (ref 3.5–5.1)
Sodium: 134 mmol/L — ABNORMAL LOW (ref 135–145)
Total Bilirubin: 0.9 mg/dL (ref 0.0–1.2)
Total Protein: 5.9 g/dL — ABNORMAL LOW (ref 6.5–8.1)

## 2023-04-14 MED ORDER — TERBUTALINE SULFATE 1 MG/ML IJ SOLN
0.2500 mg | Freq: Once | INTRAMUSCULAR | Status: AC | PRN
  Administered 2023-04-15: 0.25 mg via SUBCUTANEOUS
  Filled 2023-04-14: qty 1

## 2023-04-14 MED ORDER — ONDANSETRON HCL 4 MG/2ML IJ SOLN: 4.0000 mg | Freq: Four times a day (QID) | INTRAMUSCULAR | Status: DC | PRN

## 2023-04-14 MED ORDER — LIDOCAINE HCL (PF) 1 % IJ SOLN: 30.0000 mL | INTRAMUSCULAR | Status: DC | PRN

## 2023-04-14 MED ORDER — SOD CITRATE-CITRIC ACID 500-334 MG/5ML PO SOLN: 30.0000 mL | ORAL | Status: DC | PRN

## 2023-04-14 MED ORDER — MISOPROSTOL 25 MCG QUARTER TABLET
25.0000 ug | ORAL_TABLET | ORAL | Status: DC | PRN
  Administered 2023-04-14: 25 ug via VAGINAL
  Filled 2023-04-14: qty 1

## 2023-04-14 MED ORDER — ACETAMINOPHEN 325 MG PO TABS: 650.0000 mg | ORAL_TABLET | ORAL | Status: DC | PRN

## 2023-04-14 MED ORDER — LACTATED RINGERS IV SOLN: INTRAVENOUS | Status: DC

## 2023-04-14 MED ORDER — OXYTOCIN-SODIUM CHLORIDE 30-0.9 UT/500ML-% IV SOLN
2.5000 [IU]/h | INTRAVENOUS | Status: DC
  Filled 2023-04-14: qty 500

## 2023-04-14 MED ORDER — OXYTOCIN BOLUS FROM INFUSION: 333.0000 mL | Freq: Once | INTRAVENOUS | Status: DC

## 2023-04-14 MED ORDER — LACTATED RINGERS IV SOLN
500.0000 mL | INTRAVENOUS | Status: DC | PRN
  Administered 2023-04-15: 500 mL via INTRAVENOUS
  Administered 2023-04-15: 1000 mL via INTRAVENOUS

## 2023-04-14 NOTE — Progress Notes (Signed)
 Patricia [name] is a 23 y.o. G2P0010 at [redacted]w[redacted]d admitted for induction of labor for gestational HTN,  Chart reviewed remotely: Objective: LMP 07/16/2022  No intake/output data recorded. No intake/output data recorded.  FHT:  FHR: 150 bpm, variability: moderate,  accelerations:  Present,  decelerations:  Absent UC:   irregular, every 10 to 20 minutes SVE:   Dilation: 2 Effacement (%): 60 Station: -2 Exam by:: Renie Carver, RN  Labs: Lab Results  Component Value Date   WBC 16.1 (H) 04/14/2023   HGB 12.8 04/14/2023   HCT 37.9 04/14/2023   MCV 83.1 04/14/2023   PLT 162 04/14/2023   CMP     Component Value Date/Time   NA 134 (L) 04/14/2023 1945   K 3.9 04/14/2023 1945   CL 107 04/14/2023 1945   CO2 19 (L) 04/14/2023 1945   GLUCOSE 85 04/14/2023 1945   BUN 7 04/14/2023 1945   CREATININE 0.60 04/14/2023 1945   CALCIUM 8.8 (L) 04/14/2023 1945   PROT 5.9 (L) 04/14/2023 1945   ALBUMIN 2.6 (L) 04/14/2023 1945   AST 16 04/14/2023 1945   ALT 17 04/14/2023 1945   ALKPHOS 179 (H) 04/14/2023 1945   BILITOT 0.9 04/14/2023 1945   GFRNONAA >60 04/14/2023 1945    .  Assessment / Plan: 23 y.o. G2P0010 at [redacted]w[redacted]d admitted for induction of labor for gestational HTN,  Labor:  Vaginal cytotec  placed at 8.30 pm, for cervical reassessment in 4 hours.  Preeclampsia:   Gestational HTN, labs are stable.  Urine PCR pending.  Fetal Wellbeing:  Category I. Pain Control:  Labor support without medications, Epidural, IV pain meds, and Nitrous Oxide. I/D:   GBS negative. Anticipated MOD:  NSVD.  Charlott Converse, MD 04/14/2023, 9:19 PM

## 2023-04-14 NOTE — H&P (Signed)
 OBSTETRIC ADMISSION HISTORY AND PHYSICAL  Patricia [name] is a 23 y.o. female G2P0010 with IUP at [redacted]w[redacted]d by LMP presenting for pregnancy induced hypertension due to persistent elevated blood pressure readings in office and at home. PCR collected in office 04/13/23 but not yet resulted therefore gestational hypertension v. Preeclampsia without severe features. She reported no blurry vision, headaches or peripheral edema, and RUQ pain. She states she had a headache prior to arrival that has self resolved.   She received her prenatal care at [facility]  Dating: By LMP --->  Estimated Date of Delivery: 04/22/23  Sono:    @[redacted]w[redacted]d , CWD, normal anatomy, cephalic presentation, posterior placenta grade 3 calcified, 2813g, 6lbs 3oz 15% EFW   Prenatal History/Complications:  -Tobacco use disorder - Asthma - Migraine headaches  Past Medical History: Past Medical History:  Diagnosis Date   Asthma     Past Surgical History: Past Surgical History:  Procedure Laterality Date   TONSILLECTOMY     TYMPANOSTOMY TUBE PLACEMENT      Obstetrical History: OB History     Gravida  2   Para      Term      Preterm      AB  1   Living         SAB  1   IAB      Ectopic      Multiple      Live Births              Social History Social History   Socioeconomic History   Marital status: Single    Spouse name: Not on file   Number of children: Not on file   Years of education: Not on file   Highest education level: Not on file  Occupational History   Not on file  Tobacco Use   Smoking status: Former    Types: Cigars    Passive exposure: Current   Smokeless tobacco: Never   Tobacco comments:    Black and mild every day  Vaping Use   Vaping status: Never Used  Substance and Sexual Activity   Alcohol use: Not Currently    Comment: occ.   Drug use: Not Currently    Frequency: 7.0 times per week    Types: Marijuana    Comment: last used 4 days ago as of 10/18    Sexual activity: Yes    Partners: Male    Birth control/protection: None  Other Topics Concern   Not on file  Social History Narrative   Not on file   Social Drivers of Health   Financial Resource Strain: Not on file  Food Insecurity: Not on file  Transportation Needs: Not on file  Physical Activity: Not on file  Stress: Not on file  Social Connections: Not on file    Family History: Family History  Problem Relation Age of Onset   Kidney Stones Mother    Asthma Father     Allergies: No Known Allergies  No medications prior to admission.     Review of Systems   All systems reviewed and negative except as stated in HPI  Last menstrual period 07/16/2022. General: Appears well, no acute distress. Age appropriate. Cardiac: All extremities appear to be well perfused Respiratory: normal effort Abdomen: appropriately gravid for gestational age Extremities: Ambulating. Mild LE edema +1 pitting.  Skin: Warm and dry, no rashes noted Neuro: alert and oriented, no focal deficits Psych: normal affect      Prenatal  labs: ABO, Rh:  B+ Antibody:  Neg Rubella:   Immune RPR:   NR HBsAg:   Negative  HIV:   NR GBS:   Neg 1 hr Glucola 195, 3hr Normal Genetic screening  LR, female Anatomy US  limited view, follow up wnl  Prenatal Transfer Tool  Maternal Diabetes: No Genetic Screening: Normal Maternal Ultrasounds/Referrals: Normal Fetal Ultrasounds or other Referrals:  None Maternal Substance Abuse:  Yes:  Type: Smoker Significant Maternal Medications:  None Significant Maternal Lab Results:  Group B Strep negative Number of Prenatal Visits:greater than 3 verified prenatal visits Other Comments:  None  No results found for this or any previous visit (from the past 24 hours).  Patient Active Problem List   Diagnosis Date Noted   Pregnancy induced hypertension 04/14/2023   Migraine without aura and without status migrainosus, not intractable 03/20/2022   SAB  (spontaneous abortion) 03/20/2022    Assessment/Plan:  Patricia [name] is a 23 y.o. G2P0010 at [redacted]w[redacted]d here for IOL 2/2 gHTN v. preE. BPs mild range.   #Labor: Start with vaginal cytotec  25 mcg q4h PRN for cervical ripening #Pain: Per maternal request  #ID: GBS neg  gHTN v. preE reported mild range PCR from office pending otherwise PLT 148, Cr 0.57. Calcific placenta seen on on 04/08/23 US .   -Follow up PIH labs -Monitor BP  CCOBGYN on overnight and Dr. Wynona Hedger will assume care in the morning at 7AM.   [facility], DO  04/14/2023, 6:22 PM

## 2023-04-15 ENCOUNTER — Encounter (HOSPITAL_COMMUNITY): Payer: Self-pay | Admitting: Family Medicine

## 2023-04-15 ENCOUNTER — Inpatient Hospital Stay (HOSPITAL_COMMUNITY): Payer: 59 | Admitting: Anesthesiology

## 2023-04-15 LAB — CBC
HCT: 39.1 % (ref 36.0–46.0)
HCT: 34.7 % — ABNORMAL LOW (ref 36.0–46.0)
Hemoglobin: 13.4 g/dL (ref 12.0–15.0)
Hemoglobin: 11.8 g/dL — ABNORMAL LOW (ref 12.0–15.0)
MCH: 28.4 pg (ref 26.0–34.0)
MCH: 28.5 pg (ref 26.0–34.0)
MCHC: 34.3 g/dL (ref 30.0–36.0)
MCHC: 34 g/dL (ref 30.0–36.0)
MCV: 82.8 fL (ref 80.0–100.0)
MCV: 83.8 fL (ref 80.0–100.0)
Platelets: 165 10*3/uL (ref 150–400)
Platelets: 147 10*3/uL — ABNORMAL LOW (ref 150–400)
RBC: 4.72 MIL/uL (ref 3.87–5.11)
RBC: 4.14 MIL/uL (ref 3.87–5.11)
RDW: 15.2 % (ref 11.5–15.5)
RDW: 15.4 % (ref 11.5–15.5)
WBC: 18.6 10*3/uL — ABNORMAL HIGH (ref 4.0–10.5)
WBC: 20 10*3/uL — ABNORMAL HIGH (ref 4.0–10.5)
nRBC: 0 % (ref 0.0–0.2)
nRBC: 0 % (ref 0.0–0.2)

## 2023-04-15 LAB — RPR: RPR Ser Ql: NONREACTIVE

## 2023-04-15 MED ORDER — SIMETHICONE 80 MG PO CHEW: 80.0000 mg | CHEWABLE_TABLET | ORAL | Status: DC | PRN

## 2023-04-15 MED ORDER — WITCH HAZEL-GLYCERIN EX PADS: 1.0000 | MEDICATED_PAD | CUTANEOUS | Status: DC | PRN

## 2023-04-15 MED ORDER — DIPHENHYDRAMINE HCL 25 MG PO CAPS: 25.0000 mg | ORAL_CAPSULE | Freq: Four times a day (QID) | ORAL | Status: DC | PRN

## 2023-04-15 MED ORDER — DIBUCAINE (PERIANAL) 1 % EX OINT: 1.0000 | TOPICAL_OINTMENT | CUTANEOUS | Status: DC | PRN

## 2023-04-15 MED ORDER — ALBUTEROL SULFATE HFA 108 (90 BASE) MCG/ACT IN AERS: 1.0000 | INHALATION_SPRAY | Freq: Four times a day (QID) | RESPIRATORY_TRACT | Status: DC | PRN

## 2023-04-15 MED ORDER — LIDOCAINE HCL (PF) 1 % IJ SOLN
INTRAMUSCULAR | Status: DC | PRN
  Administered 2023-04-15: 11 mL via EPIDURAL

## 2023-04-15 MED ORDER — BENZOCAINE-MENTHOL 20-0.5 % EX AERO
1.0000 | INHALATION_SPRAY | CUTANEOUS | Status: DC | PRN
  Administered 2023-04-15: 1 via TOPICAL
  Filled 2023-04-15: qty 56

## 2023-04-15 MED ORDER — ONDANSETRON HCL 4 MG/2ML IJ SOLN: 4.0000 mg | INTRAMUSCULAR | Status: DC | PRN

## 2023-04-15 MED ORDER — PRENATAL MULTIVITAMIN CH
1.0000 | ORAL_TABLET | Freq: Every day | ORAL | Status: DC
  Administered 2023-04-16: 1 via ORAL
  Filled 2023-04-15: qty 1

## 2023-04-15 MED ORDER — ACETAMINOPHEN 325 MG PO TABS
650.0000 mg | ORAL_TABLET | ORAL | Status: DC | PRN
  Administered 2023-04-15 – 2023-04-16 (×2): 650 mg via ORAL
  Filled 2023-04-15 (×2): qty 2

## 2023-04-15 MED ORDER — FENTANYL CITRATE (PF) 100 MCG/2ML IJ SOLN
100.0000 ug | Freq: Once | INTRAMUSCULAR | Status: AC
  Administered 2023-04-15: 100 ug via INTRAVENOUS
  Filled 2023-04-15: qty 2

## 2023-04-15 MED ORDER — TETANUS-DIPHTH-ACELL PERTUSSIS 5-2.5-18.5 LF-MCG/0.5 IM SUSY: 0.5000 mL | PREFILLED_SYRINGE | Freq: Once | INTRAMUSCULAR | Status: DC

## 2023-04-15 MED ORDER — EPHEDRINE 5 MG/ML INJ
10.0000 mg | INTRAVENOUS | Status: DC | PRN
Start: 2023-04-15 — End: 2023-04-15

## 2023-04-15 MED ORDER — LACTATED RINGERS IV SOLN: 500.0000 mL | Freq: Once | INTRAVENOUS | Status: DC

## 2023-04-15 MED ORDER — SENNOSIDES-DOCUSATE SODIUM 8.6-50 MG PO TABS
2.0000 | ORAL_TABLET | Freq: Every day | ORAL | Status: DC
  Administered 2023-04-16 – 2023-04-17 (×2): 2 via ORAL
  Filled 2023-04-15 (×3): qty 2

## 2023-04-15 MED ORDER — COCONUT OIL OIL: 1.0000 | TOPICAL_OIL | Status: DC | PRN

## 2023-04-15 MED ORDER — FERROUS SULFATE 325 (65 FE) MG PO TABS
325.0000 mg | ORAL_TABLET | Freq: Every day | ORAL | Status: DC
  Administered 2023-04-16 – 2023-04-17 (×2): 325 mg via ORAL
  Filled 2023-04-15 (×2): qty 1

## 2023-04-15 MED ORDER — ONDANSETRON HCL 4 MG PO TABS
4.0000 mg | ORAL_TABLET | ORAL | Status: DC | PRN
  Filled 2023-04-15: qty 1

## 2023-04-15 MED ORDER — FENTANYL-BUPIVACAINE-NACL 0.5-0.125-0.9 MG/250ML-% EP SOLN
12.0000 mL/h | EPIDURAL | Status: DC | PRN
  Administered 2023-04-15: 12 mL/h via EPIDURAL
  Filled 2023-04-15: qty 250

## 2023-04-15 MED ORDER — TERBUTALINE SULFATE 1 MG/ML IJ SOLN: 0.2500 mg | Freq: Once | INTRAMUSCULAR | Status: DC | PRN

## 2023-04-15 MED ORDER — ZOLPIDEM TARTRATE 5 MG PO TABS: 5.0000 mg | ORAL_TABLET | Freq: Every evening | ORAL | Status: DC | PRN

## 2023-04-15 MED ORDER — OXYTOCIN-SODIUM CHLORIDE 30-0.9 UT/500ML-% IV SOLN
1.0000 m[IU]/min | INTRAVENOUS | Status: DC
Start: 2023-04-15 — End: 2023-04-15
  Administered 2023-04-15: 2 m[IU]/min via INTRAVENOUS

## 2023-04-15 MED ORDER — PHENYLEPHRINE 80 MCG/ML (10ML) SYRINGE FOR IV PUSH (FOR BLOOD PRESSURE SUPPORT)
80.0000 ug | PREFILLED_SYRINGE | INTRAVENOUS | Status: DC | PRN
  Filled 2023-04-15: qty 10

## 2023-04-15 MED ORDER — DIPHENHYDRAMINE HCL 50 MG/ML IJ SOLN: 12.5000 mg | INTRAMUSCULAR | Status: DC | PRN

## 2023-04-15 MED ORDER — IBUPROFEN 600 MG PO TABS
600.0000 mg | ORAL_TABLET | Freq: Four times a day (QID) | ORAL | Status: DC
  Administered 2023-04-15 – 2023-04-17 (×7): 600 mg via ORAL
  Filled 2023-04-15 (×7): qty 1

## 2023-04-15 MED ORDER — EPHEDRINE 5 MG/ML INJ: 10.0000 mg | INTRAVENOUS | Status: DC | PRN

## 2023-04-15 MED ORDER — PHENYLEPHRINE 80 MCG/ML (10ML) SYRINGE FOR IV PUSH (FOR BLOOD PRESSURE SUPPORT): 80.0000 ug | PREFILLED_SYRINGE | INTRAVENOUS | Status: DC | PRN

## 2023-04-15 NOTE — Progress Notes (Signed)
  Subjective:  Patient reports feeling contractions. They are not graphing well.   Objective: BP 126/63   Pulse (!) 113   Temp 98.2 F (36.8 C) (Oral)   Resp 16   LMP 07/16/2022   SpO2 100%  No intake/output data recorded. No intake/output data recorded.  FHT:  FHR: 150 bpm, variability: moderate,  accelerations:  Present,  decelerations:  Absent UC:   not graphing well  SVE:   Dilation: 4.5 Effacement (%): 80 Station: -2 Exam by:: Orvil Feil, RN  Labs: Lab Results  Component Value Date   WBC 18.6 (H) 04/15/2023   HGB 13.4 04/15/2023   HCT 39.1 04/15/2023   MCV 82.8 04/15/2023   PLT 165 04/15/2023    Assessment / Plan: Induction of labor due to gestational hypertension   Labor:  pitocin discontinued due to repetitive late decelerationa. Pt received terbutline and a fluid bolus. FHR is now category 1  Preeclampsia:   NA Fetal Wellbeing:  Category I Pain Control:  Epidural I/D:   Negative Anticipated MOD:  NSVD  Gerald Leitz, MD 04/15/2023, 9:23 AM

## 2023-04-15 NOTE — Anesthesia Procedure Notes (Signed)
Epidural Patient location during procedure: OB Start time: 04/15/2023 6:45 AM End time: 04/15/2023 6:59 AM  Staffing Anesthesiologist: Lowella Curb, MD Performed: anesthesiologist   Preanesthetic Checklist Completed: patient identified, IV checked, site marked, risks and benefits discussed, surgical consent, monitors and equipment checked, pre-op evaluation and timeout performed  Epidural Patient position: sitting Prep: ChloraPrep Patient monitoring: heart rate, cardiac monitor, continuous pulse ox and blood pressure Approach: midline Location: L2-L3 Injection technique: LOR saline  Needle:  Needle type: Tuohy  Needle gauge: 17 G Needle length: 9 cm Needle insertion depth: 6 cm Catheter type: closed end flexible Catheter size: 20 Guage Catheter at skin depth: 10 cm Test dose: negative  Assessment Events: blood not aspirated, injection not painful, no injection resistance, no paresthesia and negative IV test  Additional Notes Reason for block:procedure for pain

## 2023-04-15 NOTE — Lactation Note (Signed)
This note was copied from a baby's chart. Lactation Consultation Note  Patient Name: Girl Meilynn Noy [id number] Date: 04/15/2023 Age:24 hours Reason for consult: Initial assessment;Primapara;1st time breastfeeding;Term;Breastfeeding assistance  P1- MOB plans to offer both formula and breast milk. MOB reports that infant had not eaten at all since being born. MOB stated that she had just tried to formula feed infant, but infant would not take it. MOB also voiced concerns over not having any colostrum for infant. LC offered to demonstrate how to hand express and MOB consented. LC noted rolling colostrum bilaterally. LC was able to express 1 mL of EBM from 3 expressions. MOB could have collected more, but her breasts were sensitive at this time. LC spoon fed EBM to infant. Infant tolerated feeding well.  Infant became a little more alert, so LC recommended attempting a latch. MOB agreed. LC placed infant on the left breast in the football hold. Infant would not open her mouth to latch. She kept her jaw clamped. After 10 minutes of trying to latching, LC offered to formula feed infant with the bottle MOB attempted to offer. MOB agreed. LC was able to bottle feed infant 18 mL of the formula. Infant tolerated the feeding very well and had a strong rhythmic suck.  LC reviewed feeding infant on cue 8-12x in 24 hrs, not allowing infant to go over 3 hrs without a feeding, CDC milk storage guidelines and LC services handout. LC encouraged MOB to call for further assistance as needed.  Maternal Data Has patient been taught Hand Expression?: Yes Does the patient have breastfeeding experience prior to this delivery?: No  Feeding Mother's Current Feeding Choice: Breast Milk and Formula Nipple Type: Slow - flow  LATCH Score Latch: Too sleepy or reluctant, no latch achieved, no sucking elicited.  Audible Swallowing: None  Type of Nipple: Everted at rest and after stimulation  Comfort (Breast/Nipple): Soft  / non-tender  Hold (Positioning): Full assist, staff holds infant at breast  LATCH Score: 4   Lactation Tools Discussed/Used Pump Education: Milk Storage  Interventions Interventions: Breast feeding basics reviewed;Assisted with latch;Hand express;Breast compression;Adjust position;Support pillows;Position options;Expressed milk;Education;Pace feeding;LC Services brochure  Discharge Discharge Education: Warning signs for feeding baby Pump: DEBP;Personal (reports she is getting it from an RN through a program)  Consult Status Consult Status: Follow-up Date: 04/16/23 Follow-up type: In-patient    Dema Severin BS, IBCLC 04/15/2023, 9:47 PM

## 2023-04-15 NOTE — Progress Notes (Signed)
  Subjective: Patient is comfortable with her epidural.   Objective: BP 123/64   Pulse (!) 110   Temp 98.2 F (36.8 C) (Axillary)   Resp 16   LMP 07/16/2022   SpO2 98%  No intake/output data recorded. Total I/O In: -  Out: 275 [Urine:275]  FHT:  FHR: 170 bpm, variability: moderate,  accelerations:  Abscent,  decelerations:  Present variable possible late UC:   regular, every 2 minutes SVE:   Dilation: 7 Effacement (%): 100 Station: -1 Exam by:: Dr. Richardson Dopp AROM clear fluid.. IUPC placed. FSE placed   Labs: Lab Results  Component Value Date   WBC 18.6 (H) 04/15/2023   HGB 13.4 04/15/2023   HCT 39.1 04/15/2023   MCV 82.8 04/15/2023   PLT 165 04/15/2023    Assessment / Plan: Induction of labor due to gestational hypertension   Labor: Progressing normally Preeclampsia:  labs stable Fetal Wellbeing:  Category II currently will give 500 cc IV fluid bolus and position change  Pain Control:  Epidural I/D:  n/a Anticipated MOD:   undetermined  Gerald Leitz, MD 04/15/2023, 1:32 PM

## 2023-04-15 NOTE — Anesthesia Preprocedure Evaluation (Signed)
Anesthesia Evaluation  Patient identified by MRN, date of birth, ID band Patient awake    Reviewed: Allergy & Precautions, H&P , NPO status , Patient's Chart, lab work & pertinent test results  Airway Mallampati: II  TM Distance: >3 FB Neck ROM: Full    Dental no notable dental hx.    Pulmonary asthma , former smoker   Pulmonary exam normal breath sounds clear to auscultation       Cardiovascular hypertension, Normal cardiovascular exam Rhythm:Regular Rate:Normal     Neuro/Psych  Headaches  negative psych ROS   GI/Hepatic negative GI ROS, Neg liver ROS,,,  Endo/Other  negative endocrine ROS    Renal/GU negative Renal ROS  negative genitourinary   Musculoskeletal negative musculoskeletal ROS (+)    Abdominal   Peds negative pediatric ROS (+)  Hematology negative hematology ROS (+)   Anesthesia Other Findings   Reproductive/Obstetrics (+) Pregnancy                             Anesthesia Physical Anesthesia Plan  ASA: 2  Anesthesia Plan: Epidural   Post-op Pain Management:    Induction:   PONV Risk Score and Plan:   Airway Management Planned:   Additional Equipment:   Intra-op Plan:   Post-operative Plan:   Informed Consent:   Plan Discussed with:   Anesthesia Plan Comments:        Anesthesia Quick Evaluation

## 2023-04-16 LAB — CBC
HCT: 31.4 % — ABNORMAL LOW (ref 36.0–46.0)
Hemoglobin: 10.5 g/dL — ABNORMAL LOW (ref 12.0–15.0)
MCH: 28.2 pg (ref 26.0–34.0)
MCHC: 33.4 g/dL (ref 30.0–36.0)
MCV: 84.2 fL (ref 80.0–100.0)
Platelets: 138 10*3/uL — ABNORMAL LOW (ref 150–400)
RBC: 3.73 MIL/uL — ABNORMAL LOW (ref 3.87–5.11)
RDW: 15.5 % (ref 11.5–15.5)
WBC: 20.4 10*3/uL — ABNORMAL HIGH (ref 4.0–10.5)
nRBC: 0 % (ref 0.0–0.2)

## 2023-04-16 MED ORDER — NIFEDIPINE ER OSMOTIC RELEASE 30 MG PO TB24
30.0000 mg | ORAL_TABLET | Freq: Every day | ORAL | Status: DC
  Administered 2023-04-16 – 2023-04-17 (×2): 30 mg via ORAL
  Filled 2023-04-16 (×3): qty 1

## 2023-04-16 NOTE — Lactation Note (Signed)
This note was copied from a baby's chart. Lactation Consultation Note  Patient Name: Patricia [name] [id number] Date: 04/16/2023 Age:24 hours Reason for consult: Follow-up assessment;1st time breastfeeding;Term;Infant weight loss;Mother's request (2 % weight loss) RN requested to see this mom.  As LC entered the room mom mentioned she had figured her hand free DEBP out. See below for [facility] checking the flange size.  LC recommended to consider using the Symphony DEBP for the strength of enhancing milk to come in.  Mom's lunch came and mentioned she'll pump later.     Maternal Data Has patient been taught Hand Expression?: Yes  Feeding Mother's Current Feeding Choice: Breast Milk and Formula Nipple Type: Slow - flow  LATCH Score - BF 10 mins early on and 2 LC scores were 4      Lactation Tools Discussed/Used Tools: Pump;Flanges Flange Size: 21;24;Other (comment) (LC rechecked the flange size and the #21 F and #24 F were comfortable) Breast pump type: Manual;Double-Electric Breast Pump;Other (comment) (Per mom asked to have a DEBP set up and RN set it up) Pump Education: Milk Storage;Setup, frequency, and cleaning Reason for Pumping: mom requested per mom. during the [facility] consult per mom desires to use her hands free Patricia [name] - and mom was trying figure out how the flange fit in. LC 1st sized the flange with a hand pump , it was #21 f and #24 F - see LC note  Interventions  Reviewed set up the of the DEBP and flange size.   Discharge Pump: Personal;Hands Free;Manual  Consult Status Consult Status: Follow-up Date: 04/17/23 Follow-up type: In-patient    Patricia [name] 04/16/2023, 2:48 PM

## 2023-04-16 NOTE — Anesthesia Postprocedure Evaluation (Signed)
Anesthesia Post Note  Patient: Vernona Rieger  Procedure(s) Performed: AN AD HOC LABOR EPIDURAL     Patient location during evaluation: Mother Baby Anesthesia Type: Epidural Level of consciousness: awake and alert Pain management: pain level controlled Vital Signs Assessment: post-procedure vital signs reviewed and stable Respiratory status: spontaneous breathing, nonlabored ventilation and respiratory function stable Cardiovascular status: stable Postop Assessment: no headache, no backache, epidural receding, no apparent nausea or vomiting, patient able to bend at knees, able to ambulate and adequate PO intake Anesthetic complications: no   No notable events documented.  Last Vitals:  Vitals:   04/16/23 0415 04/16/23 0817  BP: (!) 119/98 (!) 127/90  Pulse: 81 80  Resp: 18 18  Temp:  36.8 C  SpO2: 100% 97%    Last Pain:  Vitals:   04/16/23 0817  TempSrc: Oral  PainSc: 0-No pain   Pain Goal: Patients Stated Pain Goal: 1 (04/15/23 0219)                 Laban Emperor

## 2023-04-16 NOTE — Progress Notes (Signed)
POSTPARTUM PROGRESS NOTE  Post Partum Day 1  Subjective:  Patricia [name] is a 23 y.o. G2P1011 s/p VD at [redacted]w[redacted]d.  She reports she is doing well. No acute events overnight. She denies any problems with ambulating, voiding or po intake. Denies nausea or vomiting.  Pain is well controlled.  Lochia is appropriate.  Objective: Blood pressure (!) 119/98, pulse 81, temperature 97.9 F (36.6 C), temperature source Oral, resp. rate 18, last menstrual period 07/16/2022, SpO2 100%, unknown if currently breastfeeding.  Physical Exam:  General: alert, cooperative and no distress Chest: no respiratory distress Heart:regular rate, distal pulses intact Abdomen: soft, nontender,  Uterine Fundus: firm, appropriately tender DVT Evaluation: No calf swelling or tenderness Extremities: No LE edema Skin: warm, dry  Recent Labs    04/15/23 1803 04/16/23 0457  HGB 11.8* 10.5*  HCT 34.7* 31.4*    Assessment/Plan: Patricia [name] is a 23 y.o. G2P1011 s/p VD at [redacted]w[redacted]d   PPD#1 - Doing well  Routine postpartum care  gHTN BP has remained elevated PP -Start procardia 30 mg daily -continue to monitor and consider  Increasing procardia or adding lasix as need for better BP control -Continue to monitor BP -1 week BP check in the office following discharge  Dispo: Plan for discharge 1/18.   LOS: 2 days   Tristen Pennino Autry-Lott, DO 04/16/2023, 8:14 AM

## 2023-04-16 NOTE — Clinical Social Work Maternal (Signed)
CLINICAL SOCIAL WORK MATERNAL/CHILD NOTE   Patient Details  Name: Patricia [name] MRN: [id number] Date of Birth: 01/29/2001   Date:  10-24-23   Clinical Social Worker Initiating Note:  Willaim Rayas Keyden Pavlov      Date/Time: Initiated:  04/16/23/1457      Child's Name:  Patricia [name] January 28, 2025    Biological Parents:  Mother, Father Patricia [name] 07/31/1999, Patricia [name] 03/16/1999)    Need for Interpreter:  None    Reason for Referral:  Current Substance Use/Substance Use During Pregnancy      Address:  [address] [place] [address]    Phone number:  [phone] (home)      Additional phone number:    Household Members/Support Persons (HM/SP):   Household Member/Support Person 1     HM/SP Name Relationship DOB or Age  HM/SP -1 Patricia [name] FOB 03/16/1999  HM/SP -2     HM/SP -3     HM/SP -4     HM/SP -5     HM/SP -6     HM/SP -7     HM/SP -8         Natural Supports (not living in the home):  Parent    Professional Supports: None    Employment: Full-time    Type of Work:      Education:  Some Economist arranged:     Surveyor, quantity Resources:  [employer], Media planner     Other Resources:  Sales executive  , [employer]    Cultural/Religious Considerations Which May Impact Care:     Strengths:  Ability to meet basic needs  , Compliance with medical plan  , Pediatrician chosen    Psychotropic Medications:          Pediatrician:    Armed forces operational officer area   Pediatrician List:    [facility]  [place]   [place]   [place] [facility]       Pediatrician Fax Number:     Risk Factors/Current Problems:  None    Cognitive State:  Able to Concentrate  , Alert      Mood/Affect:  Comfortable  , Calm      CSW Assessment: CSW received consult for THC use during pregnancy, CSW met with MOB to complete assessment and offer support. Csw entered the room and observed MOB  enjoying lunch, and the infant in the bassinet. CSW introduced self, CSW role and reason for visit, MOB was agreeable to visit. CSW inquired about how MOB was feeling, MOB reported feeling good and remained engaged throughout the assessment. CSW verified MOB address and phone number, MOB confirmed the information on file was correct. CSW inquired about MOB hx, MOB denied any MH concerns. CSW provided education regarding the baby blues period vs. perinatal mood disorders, discussed treatment and gave resources for mental health follow up if concerns arise.  CSW recommends self-evaluation during the postpartum time period using the New Mom Checklist from Postpartum Progress and encouraged MOB to contact a medical professional if symptoms are noted at any time. CSW identified her mom as her support.    CSW inquired about MOB THC use, MOB reported she stopped use when she found out she was pregnant around 11 weeks. MOB reported prior to that she smoked daily.  MOB reported she ingested THC unknowingly at a party on New years. CSW inform MOB of the hospital during screen policy, MOB verbalized understanding.  CSW informed MOB infant UDS was negative and the CDS was pending. MOB verbalized understanding.    CSW provided review of Sudden Infant Death Syndrome (SIDS) precautions.  MOB reported she has all necessary items for the infant including a bassinet and car seat.  CSW identifies no further need for intervention and no barriers to discharge at this time.   CSW Plan/Description:  No Further Intervention Required/No Barriers to Discharge, Sudden Infant Death Syndrome (SIDS) Education, Perinatal Mood and Anxiety Disorder (PMADs) Education, CSW Will Continue to Monitor Umbilical Cord Tissue Drug Screen Results and Make Report if [facility] Drug Screen Policy Information      Maud Deed, [place] Aug 07, 2023, 3:10 PM

## 2023-04-17 ENCOUNTER — Encounter (HOSPITAL_COMMUNITY): Payer: Self-pay | Admitting: Family Medicine

## 2023-04-17 MED ORDER — IBUPROFEN 600 MG PO TABS: 600.0000 mg | ORAL_TABLET | Freq: Four times a day (QID) | ORAL | 0 refills | Status: DC

## 2023-04-17 MED ORDER — ACETAMINOPHEN 325 MG PO TABS: 650.0000 mg | ORAL_TABLET | ORAL | Status: AC | PRN

## 2023-04-17 MED ORDER — NIFEDIPINE ER 30 MG PO TB24: 30.0000 mg | ORAL_TABLET | Freq: Every day | ORAL | 0 refills | Status: DC

## 2023-04-17 NOTE — Progress Notes (Signed)
The Rn reviewed baby scripts implementation again. The patient stated that she would work on downing loading the app.

## 2023-04-17 NOTE — Plan of Care (Signed)
  Problem: Education: Goal: Knowledge of General Education information will improve Description: Including pain rating scale, medication(s)/side effects and non-pharmacologic comfort measures Outcome: Completed/Met

## 2023-04-17 NOTE — Discharge Summary (Signed)
Postpartum Discharge Summary  Date of Service updated 04/17/23    Patient Name: Patricia [name] DOB: [birth year] MRN: [id number]  Date of admission: 04/14/2023 Delivery date:04/15/2023 Delivering provider: Gerald Leitz Date of discharge: 04/17/2023  Admitting diagnosis: Pregnancy induced hypertension [O13.9] Intrauterine pregnancy: [redacted]w[redacted]d     Secondary diagnosis:  Principal Problem:   Pregnancy induced hypertension Active Problems:   SVD (spontaneous vaginal delivery)  Additional problems: none    Discharge diagnosis: Term Pregnancy Delivered and Gestational Hypertension                                              Post partum procedures: none Augmentation: Pitocin and Cytotec Complications: None  Hospital course: Induction of Labor With Vaginal Delivery   23 y.o. yo G2P1011 at [redacted]w[redacted]d was admitted to the hospital 04/14/2023 for induction of labor.  Indication for induction: Gestational hypertension.  Patient had an uncomplicated labor course Membrane Rupture Time/Date: 1:11 PM,04/15/2023  Delivery Method:Vaginal, Spontaneous Operative Delivery:N/A Episiotomy: Median Episiotomy: Right medial lateral   Details of delivery can be found in separate delivery note.  Patient had an uncomplicated postpartum course. Patient is discharged home 04/17/23.  Newborn Data: Birth date:04/15/2023 Birth time:3:36 PM Gender:Female Living status:Living Apgars:8 ,9  Weight:2940 g  Magnesium Sulfate received: No BMZ received: No Rhophylac:N/A MMR:No T-DaP: declined Flu: No RSV Vaccine received: No Transfusion:No Immunizations administered: There is no immunization history for the selected administration types on file for this patient.  Physical exam  Vitals:   04/16/23 0817 04/16/23 1341 04/16/23 2014 04/17/23 0510  BP: (!) 127/90 124/81 136/77 124/86  Pulse: 80 78 87 69  Resp: 18 18 18 18   Temp: 98.3 F (36.8 C) 98 F (36.7 C) 98.2 F (36.8 C) 97.8 F (36.6 C)  TempSrc: Oral Oral  Oral Oral  SpO2: 97% 100% 100%    General: alert, cooperative, and no distress Lochia: appropriate Uterine Fundus: firm Incision: N/A DVT Evaluation: No evidence of DVT seen on physical exam. No cords or calf tenderness. No significant calf/ankle edema. Labs: Lab Results  Component Value Date   WBC 20.4 (H) 04/16/2023   HGB 10.5 (L) 04/16/2023   HCT 31.4 (L) 04/16/2023   MCV 84.2 04/16/2023   PLT 138 (L) 04/16/2023      Latest Ref Rng & Units 04/14/2023    7:45 PM  CMP  Glucose 70 - 99 mg/dL 85   BUN 6 - 20 mg/dL 7   Creatinine 8.29 - 5.62 mg/dL 1.30   Sodium 865 - 784 mmol/L 134   Potassium 3.5 - 5.1 mmol/L 3.9   Chloride 98 - 111 mmol/L 107   CO2 22 - 32 mmol/L 19   Calcium 8.9 - 10.3 mg/dL 8.8   Total Protein 6.5 - 8.1 g/dL 5.9   Total Bilirubin 0.0 - 1.2 mg/dL 0.9   Alkaline Phos 38 - 126 U/L 179   AST 15 - 41 U/L 16   ALT 0 - 44 U/L 17    Edinburgh Score:    04/17/2023    3:06 AM  Edinburgh Postnatal Depression Scale Screening Tool  I have been able to laugh and see the funny side of things. 0  I have looked forward with enjoyment to things. 0  I have blamed myself unnecessarily when things went wrong. 1  I have been anxious or worried for no good reason.  0  I have felt scared or panicky for no good reason. 0  Things have been getting on top of me. 1  I have been so unhappy that I have had difficulty sleeping. 0  I have felt sad or miserable. 0  I have been so unhappy that I have been crying. 0  The thought of harming myself has occurred to me. 0  Edinburgh Postnatal Depression Scale Total 2      After visit meds:  Allergies as of 04/17/2023   No Known Allergies      Medication List     STOP taking these medications    terconazole 0.8 % vaginal cream Commonly known as: TERAZOL 3       TAKE these medications    acetaminophen 325 MG tablet Commonly known as: Tylenol Take 2 tablets (650 mg total) by mouth every 4 (four) hours as needed (for  pain scale < 4).   albuterol (2.5 MG/3ML) 0.083% nebulizer solution Commonly known as: PROVENTIL Take 3 mLs (2.5 mg total) by nebulization every 6 (six) hours as needed for wheezing or shortness of breath.   albuterol 108 (90 Base) MCG/ACT inhaler Commonly known as: VENTOLIN HFA Inhale 1-2 puffs into the lungs every 6 (six) hours as needed for wheezing or shortness of breath.   ibuprofen 600 MG tablet Commonly known as: ADVIL Take 1 tablet (600 mg total) by mouth every 6 (six) hours.   NIFEdipine 30 MG 24 hr tablet Commonly known as: ADALAT CC Take 1 tablet (30 mg total) by mouth daily.         Discharge home in stable condition Infant Feeding: Breast and formula Infant Disposition:home with mother Discharge instruction: per After Visit Summary and Postpartum booklet. Activity: Advance as tolerated. Pelvic rest for 6 weeks.  Diet: low salt diet Anticipated Birth Control: IUD Postpartum Appointment:6 weeks Additional Postpartum F/U: BP check 1 week Future Appointments: Future Appointments  Date Time Provider Department Center  06/28/2023  2:00 PM Penumalli, Glenford Bayley, MD [facility] None   Follow up Visit:  Follow-up Information     Autry-Lott, Simone, DO Follow up in 1 week(s).   Specialty: Family Medicine Why: Return to [facility] for a blood pressure check in 1 week and then again in 6 weeks for your postpartum visit. Contact information: [address] [place], [address] [place] [address] [phone]                     04/17/2023 Roma Schanz, CNM

## 2023-04-19 ENCOUNTER — Inpatient Hospital Stay (HOSPITAL_COMMUNITY)
Admission: AD | Admit: 2023-04-19 | Discharge: 2023-04-19 | Disposition: A | Discharge status: Home / Self Care | Payer: 59 | Attending: Obstetrics and Gynecology | Admitting: Obstetrics and Gynecology

## 2023-04-19 ENCOUNTER — Encounter (HOSPITAL_COMMUNITY): Payer: Self-pay | Admitting: Obstetrics and Gynecology

## 2023-04-19 DIAGNOSIS — O133 Gestational [pregnancy-induced] hypertension without significant proteinuria, third trimester: Secondary | ICD-10-CM

## 2023-04-19 DIAGNOSIS — O139 Gestational [pregnancy-induced] hypertension without significant proteinuria, unspecified trimester: Secondary | ICD-10-CM | POA: Diagnosis present

## 2023-04-19 DIAGNOSIS — Z3A39 39 weeks gestation of pregnancy: Secondary | ICD-10-CM | POA: Insufficient documentation

## 2023-04-19 LAB — COMPREHENSIVE METABOLIC PANEL
ALT: 22 U/L (ref 0–44)
AST: 20 U/L (ref 15–41)
Albumin: 2.6 g/dL — ABNORMAL LOW (ref 3.5–5.0)
Alkaline Phosphatase: 133 U/L — ABNORMAL HIGH (ref 38–126)
Anion gap: 11 (ref 5–15)
BUN: 13 mg/dL (ref 6–20)
CO2: 20 mmol/L — ABNORMAL LOW (ref 22–32)
Calcium: 8.7 mg/dL — ABNORMAL LOW (ref 8.9–10.3)
Chloride: 104 mmol/L (ref 98–111)
Creatinine, Ser: 0.61 mg/dL (ref 0.44–1.00)
GFR, Estimated: >60 mL/min (ref >60)
Glucose, Bld: 100 mg/dL — ABNORMAL HIGH (ref 70–99)
Potassium: 3.5 mmol/L (ref 3.5–5.1)
Sodium: 135 mmol/L (ref 135–145)
Total Bilirubin: 0.6 mg/dL (ref 0.0–1.2)
Total Protein: 6 g/dL — ABNORMAL LOW (ref 6.5–8.1)

## 2023-04-19 LAB — CBC
HCT: 33.4 % — ABNORMAL LOW (ref 36.0–46.0)
Hemoglobin: 11.4 g/dL — ABNORMAL LOW (ref 12.0–15.0)
MCH: 28.8 pg (ref 26.0–34.0)
MCHC: 34.1 g/dL (ref 30.0–36.0)
MCV: 84.3 fL (ref 80.0–100.0)
Platelets: 198 10*3/uL (ref 150–400)
RBC: 3.96 MIL/uL (ref 3.87–5.11)
RDW: 14.9 % (ref 11.5–15.5)
WBC: 14.4 10*3/uL — ABNORMAL HIGH (ref 4.0–10.5)
nRBC: 0 % (ref 0.0–0.2)

## 2023-04-19 MED ORDER — NIFEDIPINE ER 30 MG PO TB24: 30.0000 mg | ORAL_TABLET | Freq: Every day | ORAL | 0 refills | Status: AC

## 2023-04-19 MED ORDER — LABETALOL HCL 5 MG/ML IV SOLN
20.0000 mg | INTRAVENOUS | Status: DC | PRN
  Filled 2023-04-19: qty 4

## 2023-04-19 MED ORDER — FUROSEMIDE 20 MG PO TABS
20.0000 mg | ORAL_TABLET | Freq: Two times a day (BID) | ORAL | Status: DC
  Administered 2023-04-19: 20 mg via ORAL
  Filled 2023-04-19 (×2): qty 1

## 2023-04-19 MED ORDER — NIFEDIPINE ER OSMOTIC RELEASE 30 MG PO TB24
30.0000 mg | ORAL_TABLET | Freq: Every day | ORAL | Status: DC
  Administered 2023-04-19: 30 mg via ORAL
  Filled 2023-04-19: qty 1

## 2023-04-19 MED ORDER — HYDRALAZINE HCL 20 MG/ML IJ SOLN: 10.0000 mg | INTRAMUSCULAR | Status: DC | PRN

## 2023-04-19 MED ORDER — FUROSEMIDE 20 MG PO TABS: 20.0000 mg | ORAL_TABLET | Freq: Every day | ORAL | 0 refills | Status: AC

## 2023-04-19 MED ORDER — LABETALOL HCL 5 MG/ML IV SOLN: 80.0000 mg | INTRAVENOUS | Status: DC | PRN

## 2023-04-19 MED ORDER — FUROSEMIDE 10 MG/ML IJ SOLN
20.0000 mg | Freq: Once | INTRAMUSCULAR | Status: DC
  Filled 2023-04-19: qty 2

## 2023-04-19 MED ORDER — LABETALOL HCL 5 MG/ML IV SOLN: 40.0000 mg | INTRAVENOUS | Status: DC | PRN

## 2023-04-19 NOTE — MAU Note (Addendum)
Patricia [name] is a 23 y.o. at [redacted]w[redacted]d here in MAU reporting: by EMS  for SOB. Pt states she feels like she's been doing too much since they got home. Pt states she was feeling SOB around 0300 and used her inhaler and felt better but it took longer to work. Pt reports spots in her vision around 1600 today but not right now. Pt states she has a slight HA. Pt denies RUQ pain and abnormal swelling. Pt reports normal PP vaginal bleeding that is decreasing.   Onset of complaint: 0300 Pain score: 3/10 HA  Vitals:   04/19/23 0416  BP: 126/81  Pulse: 93  Resp: 18  Temp: 97.9 F (36.6 C)  SpO2: 97%     FHT:N/A Lab orders placed from triage:

## 2023-04-19 NOTE — MAU Provider Note (Signed)
Chief Complaint:  Shortness of Breath  HPI  HPI: Patricia [name] is a 23 y.o. G2P1011 at 96w0dwho presents to maternity admissions reporting via EMS for acute SOB, now resolved. Pt states she was feeling SOB around 0300 and used her inhaler and felt better but it took longer to work the usual.   She had spots in her vision around 1600 today but not right now.   She has a slight HA 3/10, but attributes it to not eating recently and declines pain medication.   EMS reported elevated BP of 180s/90s in route.  Denies RUQ pain and abnormal swelling. Pt reports normal PP vaginal bleeding that is decreasing.  She reports good fetal movement, denies LOF, vaginal bleeding, vaginal itching/burning, urinary symptoms, h/a, dizziness, n/v, diarrhea, constipation or fever/chills.  She denies headache, visual changes or RUQ abdominal pain.   Past Medical History: Past Medical History:  Diagnosis Date   Asthma    SAB (spontaneous abortion) 03/20/2022    Past obstetric history: OB History  Gravida Para Term Preterm AB Living  2 1 1  1 1   SAB IAB Ectopic Multiple Live Births  1   0 1    # Outcome Date GA Lbr Len/2nd Weight Sex Type Anes PTL Lv  2 Term 04/15/23 [redacted]w[redacted]d / 00:46 2940 g F Vag-Spont EPI  LIV  1 SAB 11/2021     SAB       Past Surgical History: Past Surgical History:  Procedure Laterality Date   TONSILLECTOMY     TYMPANOSTOMY TUBE PLACEMENT      Family History: Family History  Problem Relation Age of Onset   Kidney Stones Mother    Asthma Father     Social History: Social History   Tobacco Use   Smoking status: Former    Types: Cigars    Passive exposure: Current   Smokeless tobacco: Never   Tobacco comments:    Black and mild every day  Vaping Use   Vaping status: Never Used  Substance Use Topics   Alcohol use: Not Currently    Comment: occ.   Drug use: Not Currently    Frequency: 7.0 times per week    Types: Marijuana    Comment: last used 4 days ago as of  10/18    Allergies: No Known Allergies  Meds:  Medications Prior to Admission  Medication Sig Dispense Refill Last Dose/Taking   acetaminophen (TYLENOL) 325 MG tablet Take 2 tablets (650 mg total) by mouth every 4 (four) hours as needed (for pain scale < 4).   04/18/2023 at 12:00 AM   albuterol (VENTOLIN HFA) 108 (90 Base) MCG/ACT inhaler Inhale 1-2 puffs into the lungs every 6 (six) hours as needed for wheezing or shortness of breath. 18 g 0 04/18/2023 at  3:50 AM   albuterol (PROVENTIL) (2.5 MG/3ML) 0.083% nebulizer solution Take 3 mLs (2.5 mg total) by nebulization every 6 (six) hours as needed for wheezing or shortness of breath. 75 mL 12    ibuprofen (ADVIL) 600 MG tablet Take 1 tablet (600 mg total) by mouth every 6 (six) hours. 30 tablet 0    [DISCONTINUED] NIFEdipine (ADALAT CC) 30 MG 24 hr tablet Take 1 tablet (30 mg total) by mouth daily. 30 tablet 0     I have reviewed patient's Past Medical Hx, Surgical Hx, Family Hx, Social Hx, medications and allergies.   ROS:  Review of Systems Other systems negative  Physical Exam  Patient Vitals for the past 24 hrs:  BP Temp Temp src Pulse Resp SpO2 Height Weight  04/19/23 0500 (!) 143/90 -- -- (!) 106 -- 99 % -- --  04/19/23 0446 (!) 146/86 -- -- 90 -- -- -- --  04/19/23 0433 135/77 -- -- 90 -- -- -- --  04/19/23 0427 -- -- -- -- -- 98 % -- --  04/19/23 0416 126/81 97.9 F (36.6 C) Oral 93 18 97 % 5\' 4"  (1.626 m) 93.8 kg   Constitutional: Well-developed, well-nourished female in no acute distress.  Cardiovascular: normal rate and rhythm Respiratory: normal effort, clear to auscultation bilaterally GI: Abd soft, non-tender, gravid appropriate for gestational age.   No rebound or guarding. MS: Extremities nontender, mild symmetric bilateral LE edema, normal ROM, no clonus Neurologic: Alert and oriented x 4.  GU: Neg CVAT.    Labs: Results for orders placed or performed during the hospital encounter of 04/19/23 (from the past 24  hours)  Comprehensive metabolic panel     Status: Abnormal   Collection Time: 04/19/23  4:16 AM  Result Value Ref Range   Sodium 135 135 - 145 mmol/L   Potassium 3.5 3.5 - 5.1 mmol/L   Chloride 104 98 - 111 mmol/L   CO2 20 (L) 22 - 32 mmol/L   Glucose, Bld 100 (H) 70 - 99 mg/dL   BUN 13 6 - 20 mg/dL   Creatinine, Ser 4.40 0.44 - 1.00 mg/dL   Calcium 8.7 (L) 8.9 - 10.3 mg/dL   Total Protein 6.0 (L) 6.5 - 8.1 g/dL   Albumin 2.6 (L) 3.5 - 5.0 g/dL   AST 20 15 - 41 U/L   ALT 22 0 - 44 U/L   Alkaline Phosphatase 133 (H) 38 - 126 U/L   Total Bilirubin 0.6 0.0 - 1.2 mg/dL   GFR, Estimated >10 >27 mL/min   Anion gap 11 5 - 15  CBC     Status: Abnormal   Collection Time: 04/19/23  4:16 AM  Result Value Ref Range   WBC 14.4 (H) 4.0 - 10.5 K/uL   RBC 3.96 3.87 - 5.11 MIL/uL   Hemoglobin 11.4 (L) 12.0 - 15.0 g/dL   HCT 25.3 (L) 66.4 - 40.3 %   MCV 84.3 80.0 - 100.0 fL   MCH 28.8 26.0 - 34.0 pg   MCHC 34.1 30.0 - 36.0 g/dL   RDW 47.4 25.9 - 56.3 %   Platelets 198 150 - 400 K/uL   nRBC 0.0 0.0 - 0.2 %   --/--/B POS (01/15 1945)  Imaging:  No results found.  MAU Course/MDM: I have reviewed the triage vital signs and the nursing notes.   Pertinent labs & imaging results that were available during my care of the patient were reviewed by me and considered in my medical decision making (see chart for details).      I have reviewed her medical records including past results, notes and treatments.   I have ordered labs and reviewed results.   Treatments in MAU included PO Nifedipine and 20 mg Lasix.    Assessment: 1. Pregnancy induced hypertension, antepartum   2. Postpartum state   Known ppHTN prescribed Procardia 30mg  XL at discharge. Not yet started or picked up. Preeclampsia labs wnl. Mild range pressures. - Provided single dose of Procardia and Lasix here - Reemphasized importance of taking prescribed medications as directed - Patient adamant that she much get home within the  hour before her baby falls asleep, or her caregiver falls asleep; pt tearful when discussing this.  Denies thoughts of harming self or others.   Plan: Discharge home Follow up in Office for postpartum visits and recheck  Follow-up Information     Gynecology, [facility] And Follow up.   Specialty: Obstetrics and Gynecology Why: Call for a blood pressure follow up tomorrow Contact information: [address] [place] [address] [phone]                 Pt stable at time of discharge.  Wyn Forster, MD FMOB Fellow, Faculty practice [facility], Center for [facility]  04/19/2023 5:33 AM

## 2023-04-27 ENCOUNTER — Telehealth (HOSPITAL_COMMUNITY): Payer: Self-pay | Admitting: *Deleted

## 2023-04-27 NOTE — Telephone Encounter (Signed)
04/27/2023  Name: Patricia [name] MRN: [id number] DOB: Oct 18, 2000  Reason for Call:  Transition of Care Hospital Discharge Call  Contact Status: Patient Contact Status: Complete  Language assistant needed: Interpreter Mode: Interpreter Not Needed        Follow-Up Questions: Do You Have Any Concerns About Your Health As You Heal From Delivery?: No Do You Have Any Concerns About Your Infants Health?: Yes What Concerns Do You Have About Your Baby?: This patient has asthma and wonders how to tell if her newborn has it.  She says that the baby sometimes has a fast RR and HR.  We discussed normals for RR and HR in newborns.  We also discussed ways to know if baby is having difficulty breathing (nasal flairing, chest retractions, bluish tone to lips, audible wheezing or coarse breath sounds).  Advised patient to seek immediate care for baby if she notes any of the above symptoms.  Edinburgh Postnatal Depression Scale:  In the Past 7 Days: I have been able to laugh and see the funny side of things.: As much as I always could I have looked forward with enjoyment to things.: Rather less than I used to I have blamed myself unnecessarily when things went wrong.: No, never I have been anxious or worried for no good reason.: Yes, sometimes I have felt scared or panicky for no good reason.: No, not much Things have been getting on top of me.: No, I have been coping as well as ever I have been so unhappy that I have had difficulty sleeping.: Not at all I have felt sad or miserable.: Not very often I have been so unhappy that I have been crying.: Only occasionally The thought of harming myself has occurred to me.: Never Edinburgh Postnatal Depression Scale Total: 6  PHQ2-9 Depression Scale:     Discharge Follow-up: Edinburgh score requires follow up?: No Patient was advised of the following resources:: Support Group, Breastfeeding Support Group  Post-discharge interventions: Reviewed Newborn  Safe Sleep Practices  Salena Saner, RN 04/27/2023 16:27

## 2023-06-28 ENCOUNTER — Ambulatory Visit: Payer: 59 | Admitting: Diagnostic Neuroimaging

## 2023-06-28 ENCOUNTER — Encounter: Payer: Self-pay | Admitting: Diagnostic Neuroimaging

## 2023-10-21 ENCOUNTER — Other Ambulatory Visit (HOSPITAL_COMMUNITY)
Admission: RE | Admit: 2023-10-21 | Discharge: 2023-10-21 | Disposition: A | Discharge status: Home / Self Care | Source: Ambulatory Visit | Attending: Obstetrics and Gynecology | Admitting: Obstetrics and Gynecology

## 2023-10-21 DIAGNOSIS — Z124 Encounter for screening for malignant neoplasm of cervix: Secondary | ICD-10-CM | POA: Diagnosis present

## 2023-10-27 LAB — CYTOLOGY - PAP: Diagnosis: NEGATIVE

## 2023-11-26 ENCOUNTER — Encounter (HOSPITAL_COMMUNITY): Payer: Self-pay | Admitting: *Deleted

## 2023-11-26 ENCOUNTER — Ambulatory Visit (HOSPITAL_COMMUNITY)
Admission: EM | Admit: 2023-11-26 | Discharge: 2023-11-26 | Disposition: A | Discharge status: Home / Self Care | Attending: Family Medicine | Admitting: Family Medicine

## 2023-11-26 DIAGNOSIS — J029 Acute pharyngitis, unspecified: Secondary | ICD-10-CM | POA: Diagnosis not present

## 2023-11-26 DIAGNOSIS — M654 Radial styloid tenosynovitis [de Quervain]: Secondary | ICD-10-CM

## 2023-11-26 DIAGNOSIS — B349 Viral infection, unspecified: Secondary | ICD-10-CM

## 2023-11-26 LAB — POCT RAPID STREP A (OFFICE): Rapid Strep A Screen: NEGATIVE

## 2023-11-26 LAB — POC SARS CORONAVIRUS 2 AG -  ED: SARS Coronavirus 2 Ag: NEGATIVE

## 2023-11-26 NOTE — ED Provider Notes (Signed)
 [facility]    CSN: [id number] Arrival date & time: 11/26/23  1334      History   Chief Complaint Chief Complaint  Patient presents with   Wrist Pain    HPI Patricia [name] is a 23 y.o. female  presents for evaluation of URI symptoms for 3 days. Patient reports associated symptoms of cough, congestion, sore throat. Denies N/V/D, fevers, ear pain, body aches, shortness of breath. Patient does have a hx of asthma. Patient is an active smoker.  Denies any wheezing or shortness of breath and has not needed to use her butyryl inhaler.  Reports  sick contacts via her child's father.  Pt has taken nothing OTC for symptoms.  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  She has seen orthopedics twice.  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  No injury such as fall.  She is requesting a wrist brace stating that she does not currently have one.  She took ibuprofen  x 1 with temporary improvement.  Pt has no other concerns at this time.    Wrist Pain    Past Medical History:  Diagnosis Date   Asthma    SAB (spontaneous abortion) 03/20/2022    Patient Active Problem List   Diagnosis Date Noted   SVD (spontaneous vaginal delivery) 04/17/2023   Pregnancy induced hypertension 04/14/2023   Migraine without aura and without status migrainosus, not intractable 03/20/2022    Past Surgical History:  Procedure Laterality Date   TONSILLECTOMY     TYMPANOSTOMY TUBE PLACEMENT      OB History     Gravida  2   Para  1   Term  1   Preterm      AB  1   Living  1      SAB  1   IAB      Ectopic      Multiple  0   Live Births  1            Home Medications    Prior to Admission medications   Medication Sig Start Date End Date Taking? Authorizing Provider  CETIRIZINE  HCL PO Take by mouth.   Yes [provider]  Etonogestrel (NEXPLANON [place]) Inject into  the skin.   Yes [provider]  acetaminophen  (TYLENOL ) 325 MG tablet Take 2 tablets (650 mg total) by mouth every 4 (four) hours as needed (for pain scale < 4). 04/17/23   Grice, Vivian B, CNM  albuterol  (PROVENTIL ) (2.5 MG/3ML) 0.083% nebulizer solution Take 3 mLs (2.5 mg total) by nebulization every 6 (six) hours as needed for wheezing or shortness of breath. 02/21/21   Cottie Donnice PARAS, MD  albuterol  (VENTOLIN  HFA) 108 (90 Base) MCG/ACT inhaler Inhale 1-2 puffs into the lungs every 6 (six) hours as needed for wheezing or shortness of breath. 04/15/21   Eudelia Maude SAUNDERS, PA-C  furosemide  (LASIX ) 20 MG tablet Take 1 tablet (20 mg total) by mouth daily. 04/19/23   Leveque, Alyssa, MD  ibuprofen  (ADVIL ) 600 MG tablet Take 1 tablet (600 mg total) by mouth every 6 (six) hours. 04/17/23   Grice, Vivian B, CNM  NIFEdipine  (ADALAT  CC) 30 MG 24 hr tablet Take 1 tablet (30 mg total) by mouth daily. 04/19/23   Leveque, Alyssa, MD  loratadine  (CLARITIN ) 10 MG tablet Take 1 tablet (10 mg total) by mouth daily.  Patient not taking: No sig reported 12/07/11 06/11/20  Towana Dedra CROME, NP    Family History Family History  Problem Relation Age of Onset   Kidney Stones Mother    Asthma Father     Social History Social History   Tobacco Use   Smoking status: Former    Types: Cigars    Passive exposure: Current   Smokeless tobacco: Never   Tobacco comments:    Black and mild every day  Vaping Use   Vaping status: Every Day  Substance Use Topics   Alcohol use: Yes    Comment: occasional   Drug use: Yes    Types: Marijuana    Comment: occasionally     Allergies   Patient has no known allergies.   Review of Systems Review of Systems  HENT:  Positive for congestion and sore throat.   Respiratory:  Positive for cough.   Musculoskeletal:        Left wrist pain     Physical Exam Triage Vital Signs ED Triage Vitals  Encounter Vitals Group     BP 11/26/23 1416 125/66     Girls  Systolic BP Percentile --      Girls Diastolic BP Percentile --      Boys Systolic BP Percentile --      Boys Diastolic BP Percentile --      Pulse Rate 11/26/23 1416 79     Resp 11/26/23 1416 16     Temp 11/26/23 1416 98.2 F (36.8 C)     Temp Source 11/26/23 1416 Oral     SpO2 11/26/23 1416 97 %     Weight --      Height --      Head Circumference --      Peak Flow --      Pain Score 11/26/23 1418 7     Pain Loc --      Pain Education --      Exclude from Growth Chart --    No data found.  Updated Vital Signs BP 125/66   Pulse 79   Temp 98.2 F (36.8 C) (Oral)   Resp 16   SpO2 97%   Breastfeeding No   Visual Acuity Right Eye Distance:   Left Eye Distance:   Bilateral Distance:    Right Eye Near:   Left Eye Near:    Bilateral Near:     Physical Exam Vitals and nursing note reviewed.  Constitutional:      General: She is not in acute distress.    Appearance: Normal appearance. She is well-developed. She is not ill-appearing.  HENT:     Head: Normocephalic and atraumatic.     Right Ear: Tympanic membrane and ear canal normal.     Left Ear: Tympanic membrane and ear canal normal.     Nose: Congestion present.     Mouth/Throat:     Mouth: Mucous membranes are moist.     Pharynx: Oropharynx is clear. Uvula midline. Posterior oropharyngeal erythema present.     Tonsils: No tonsillar exudate or tonsillar abscesses.  Eyes:     Conjunctiva/sclera: Conjunctivae normal.     Pupils: Pupils are equal, round, and reactive to light.  Cardiovascular:     Rate and Rhythm: Normal rate and regular rhythm.     Heart sounds: Normal heart sounds.  Pulmonary:     Effort: Pulmonary effort is normal.     Breath sounds: Normal breath sounds.  Musculoskeletal:  Left wrist: No bony tenderness or snuff box tenderness.     Cervical back: Normal range of motion and neck supple.     Comments: Positive Finkelstein test with tenderness to the base of the left thumb   Lymphadenopathy:     Cervical: No cervical adenopathy.  Skin:    General: Skin is warm and dry.  Neurological:     General: No focal deficit present.     Mental Status: She is alert and oriented to person, place, and time.  Psychiatric:        Mood and Affect: Mood normal.        Behavior: Behavior normal.      UC Treatments / Results  Labs (all labs ordered are listed, but only abnormal results are displayed) Labs Reviewed  POC SARS CORONAVIRUS 2 AG -  ED  POCT RAPID STREP A (OFFICE)    EKG   Radiology No results found.  Procedures Procedures (including critical care time)  Medications Ordered in UC Medications - No data to display  Initial Impression / Assessment and Plan / UC Course  I have reviewed the triage vital signs and the nursing notes.  Pertinent labs & imaging results that were available during my care of the patient were reviewed by me and considered in my medical decision making (see chart for details).     Reviewed exam and symptoms with patient.  Patient fitted with thumb spica per request for her tenosynovitis advised her to continue follow-up with orthopedics for treatment options.  Negative rapid strep and COVID testing.  Discussed viral illness and symptomatic treatment.  Encourage rest fluids and PCP follow-up if symptoms do not improve.  ER precautions reviewed. Final Clinical Impressions(s) / UC Diagnoses   Final diagnoses:  Sore throat  Radial styloid tenosynovitis (de quervain)  Viral illness     Discharge Instructions      The wrist brace and then follow-up with orthopedics for additional treatment of your tenosynovitis.  Please treat your symptoms with over the counter cough medication, tylenol  or ibuprofen , humidifier, and rest. Viral illnesses can last 7-14 days. Please follow up with your PCP if your symptoms are not improving. Please go to the ER for any worsening symptoms. This includes but is not limited to fever you can not  control with tylenol  or ibuprofen , you are not able to stay hydrated, you have shortness of breath or chest pain.  Thank you for choosing [facility] for your healthcare needs. I hope you feel better soon!      ED Prescriptions   None    PDMP not reviewed this encounter.   Loreda Myla SAUNDERS, NP 11/26/23 [phone]

## 2023-11-26 NOTE — Discharge Instructions (Signed)
 The wrist brace and then follow-up with orthopedics for additional treatment of your tenosynovitis.  Please treat your symptoms with over the counter cough medication, tylenol  or ibuprofen , humidifier, and rest. Viral illnesses can last 7-14 days. Please follow up with your PCP if your symptoms are not improving. Please go to the ER for any worsening symptoms. This includes but is not limited to fever you can not control with tylenol  or ibuprofen , you are not able to stay hydrated, you have shortness of breath or chest pain.  Thank you for choosing [facility] for your healthcare needs. I hope you feel better soon!

## 2023-11-26 NOTE — ED Triage Notes (Signed)
 C/O left wrist pain x months - states has been seeing a specialist through [facility] and was told he can either do an injection or surgery but I'm scared. States this AM felt a popping in left wrist and had to move it around a lot until it popped again -- has not called her dr.

## 2023-12-29 ENCOUNTER — Ambulatory Visit
Admission: RE | Admit: 2023-12-29 | Discharge: 2023-12-29 | Disposition: A | Discharge status: Home / Self Care | Source: Ambulatory Visit

## 2023-12-29 VITALS — BP 126/85 | HR 86 | Temp 98.1°F | Resp 16

## 2023-12-29 DIAGNOSIS — N912 Amenorrhea, unspecified: Secondary | ICD-10-CM | POA: Diagnosis not present

## 2023-12-29 DIAGNOSIS — M654 Radial styloid tenosynovitis [de Quervain]: Secondary | ICD-10-CM

## 2023-12-29 LAB — POCT URINE PREGNANCY: Preg Test, Ur: NEGATIVE

## 2023-12-29 MED ORDER — IBUPROFEN 600 MG PO TABS: 600.0000 mg | ORAL_TABLET | Freq: Four times a day (QID) | ORAL | 0 refills | Status: AC | PRN

## 2023-12-29 NOTE — Discharge Instructions (Addendum)
 Today you have been diagnosed with a musculoskeletal injury.  Adults may use 400 mg of ibuprofen  and 1000 mg of Tylenol  together every 8 hours as needed for pain control.  Children may take ibuprofen  and Tylenol  as directed on medication packaging.  You should use ice on affected area for 20 minutes at a time a couple times a day for the first 24 hours then you may switch to heat in the same intervals.  Be sure to put a barrier between ice or heat source and skin to prevent burns.  May also wrap affected area and Ace bandage if tolerated and appropriate, and elevate above the level of the heart to help reduce swelling.  Do not wrap Ace bandages around neck or torso as wrapping too tight can restrict air movement inability to breathe.  If symptoms do not seem to be improving in 3 to 5 days after following these instructions we need to follow-up with orthopedist or PCP.

## 2023-12-29 NOTE — ED Provider Notes (Signed)
 [facility]    CSN: [id number] Arrival date & time: 12/29/23  1814      History   Chief Complaint Chief Complaint  Patient presents with   Arm Injury    Entered by patient    HPI Patricia [name] is a 23 y.o. female.   Patient with a history of de Quervain's tenosynovitis of the left wrist presents today for another wrist brace after misplacing wrist brace.  Patient also would like ibuprofen  prescribed.    Patient also states that she is experiencing phantom fetal movements and wanted to know how long they will last.  Due to experiencing these fetal movements she would like to have a pregnancy test.  Patient is currently using Nexplanon as birth control and has irregular periods since starting this form of birth control.   Arm Injury   Past Medical History:  Diagnosis Date   Asthma    SAB (spontaneous abortion) 03/20/2022    Patient Active Problem List   Diagnosis Date Noted   SVD (spontaneous vaginal delivery) 04/17/2023   Pregnancy induced hypertension 04/14/2023   Migraine without aura and without status migrainosus, not intractable 03/20/2022    Past Surgical History:  Procedure Laterality Date   TONSILLECTOMY     TYMPANOSTOMY TUBE PLACEMENT      OB History     Gravida  2   Para  1   Term  1   Preterm      AB  1   Living  1      SAB  1   IAB      Ectopic      Multiple  0   Live Births  1            Home Medications    Prior to Admission medications   Medication Sig Start Date End Date Taking? Authorizing Provider  ibuprofen  (ADVIL ) 600 MG tablet Take 1 tablet (600 mg total) by mouth every 6 (six) hours as needed. 12/29/23  Yes Andra Corean BROCKS, PA-C  acetaminophen  (TYLENOL ) 325 MG tablet Take 2 tablets (650 mg total) by mouth every 4 (four) hours as needed (for pain scale < 4). 04/17/23   Grice, Vivian B, CNM  albuterol  (PROVENTIL ) (2.5 MG/3ML) 0.083% nebulizer solution Take 3 mLs (2.5 mg total) by nebulization every  6 (six) hours as needed for wheezing or shortness of breath. 02/21/21   Cottie Donnice PARAS, MD  albuterol  (VENTOLIN  HFA) 108 [phone] Base) MCG/ACT inhaler Inhale 1-2 puffs into the lungs every 6 (six) hours as needed for wheezing or shortness of breath. 04/15/21   Eudelia Maude SAUNDERS, PA-C  CETIRIZINE  HCL PO Take by mouth.    [provider]  Etonogestrel (NEXPLANON [place]) Inject into the skin.    [provider]  furosemide  (LASIX ) 20 MG tablet Take 1 tablet (20 mg total) by mouth daily. 04/19/23   Leveque, Alyssa, MD  NIFEdipine  (ADALAT  CC) 30 MG 24 hr tablet Take 1 tablet (30 mg total) by mouth daily. 04/19/23   Leveque, Alyssa, MD  loratadine  (CLARITIN ) 10 MG tablet Take 1 tablet (10 mg total) by mouth daily. Patient not taking: No sig reported 12/07/11 06/11/20  Towana Dedra CROME, NP    Family History Family History  Problem Relation Age of Onset   Kidney Stones Mother    Asthma Father     Social History Social History   Tobacco Use   Smoking status: Former    Types: Cigars    Passive exposure:  Current   Smokeless tobacco: Never   Tobacco comments:    Black and mild every day  Vaping Use   Vaping status: Every Day  Substance Use Topics   Alcohol use: Yes    Comment: occasional   Drug use: Yes    Types: Marijuana    Comment: occasionally     Allergies   Patient has no known allergies.   Review of Systems Review of Systems   Physical Exam Triage Vital Signs ED Triage Vitals  Encounter Vitals Group     BP 12/29/23 1833 126/85     Girls Systolic BP Percentile --      Girls Diastolic BP Percentile --      Boys Systolic BP Percentile --      Boys Diastolic BP Percentile --      Pulse Rate 12/29/23 1833 86     Resp 12/29/23 1833 16     Temp 12/29/23 1833 98.1 F (36.7 C)     Temp Source 12/29/23 1833 Oral     SpO2 12/29/23 1833 97 %     Weight --      Height --      Head Circumference --      Peak Flow --      Pain Score 12/29/23 1830 4     Pain  Loc --      Pain Education --      Exclude from Growth Chart --    No data found.  Updated Vital Signs BP 126/85 (BP Location: Left Arm)   Pulse 86   Temp 98.1 F (36.7 C) (Oral)   Resp 16   SpO2 97%   Breastfeeding No   Visual Acuity Right Eye Distance:   Left Eye Distance:   Bilateral Distance:    Right Eye Near:   Left Eye Near:    Bilateral Near:     Physical Exam Vitals and nursing note reviewed.  Constitutional:      General: She is not in acute distress.    Appearance: Normal appearance. She is not ill-appearing, toxic-appearing or diaphoretic.  Eyes:     General: No scleral icterus. Cardiovascular:     Rate and Rhythm: Normal rate and regular rhythm.     Heart sounds: Normal heart sounds.  Pulmonary:     Effort: Pulmonary effort is normal. No respiratory distress.     Breath sounds: Normal breath sounds. No wheezing or rhonchi.  Musculoskeletal:     Left wrist: No swelling, deformity, effusion, lacerations or bony tenderness. Normal range of motion.     Comments: Positive Finklestein's test of left wrist  Skin:    General: Skin is warm.  Neurological:     Mental Status: She is alert and oriented to person, place, and time.  Psychiatric:        Mood and Affect: Mood normal.        Behavior: Behavior normal.      UC Treatments / Results  Labs (all labs ordered are listed, but only abnormal results are displayed) Labs Reviewed  POCT URINE PREGNANCY    EKG   Radiology No results found.  Procedures Procedures (including critical care time)  Medications Ordered in UC Medications - No data to display  Initial Impression / Assessment and Plan / UC Course  I have reviewed the triage vital signs and the nursing notes.  Pertinent labs & imaging results that were available during my care of the patient were reviewed by me and considered in  my medical decision making (see chart for details).     De Quervain's tenosynovitis-positive Finklestein's  test on exam, thumb spica splint prescribed as well as 600 mg ibuprofen  to be taken as needed every 8 hours. Final Clinical Impressions(s) / UC Diagnoses   Final diagnoses:  Amenorrhea  De Quervain's disease (tenosynovitis)     Discharge Instructions      Today you have been diagnosed with a musculoskeletal injury.  Adults may use 400 mg of ibuprofen  and 1000 mg of Tylenol  together every 8 hours as needed for pain control.  Children may take ibuprofen  and Tylenol  as directed on medication packaging.  You should use ice on affected area for 20 minutes at a time a couple times a day for the first 24 hours then you may switch to heat in the same intervals.  Be sure to put a barrier between ice or heat source and skin to prevent burns.  May also wrap affected area and Ace bandage if tolerated and appropriate, and elevate above the level of the heart to help reduce swelling.  Do not wrap Ace bandages around neck or torso as wrapping too tight can restrict air movement inability to breathe.  If symptoms do not seem to be improving in 3 to 5 days after following these instructions we need to follow-up with orthopedist or PCP.     ED Prescriptions     Medication Sig Dispense Auth. Provider   ibuprofen  (ADVIL ) 600 MG tablet Take 1 tablet (600 mg total) by mouth every 6 (six) hours as needed. 30 tablet Andra Corean BROCKS, PA-C      PDMP not reviewed this encounter.   Andra Corean BROCKS, PA-C 12/29/23 1901

## 2023-12-29 NOTE — ED Triage Notes (Addendum)
 Pt reports mothers wrist and she felt the bone popped in the left wrist yesterday. Pt can't find her wrist brace.   Pt requested pregnancy test, as she feels baby kicks in her abdomen.

## 2024-01-17 DIAGNOSIS — F4323 Adjustment disorder with mixed anxiety and depressed mood: Secondary | ICD-10-CM | POA: Diagnosis not present

## 2024-01-27 DIAGNOSIS — M654 Radial styloid tenosynovitis [de Quervain]: Secondary | ICD-10-CM | POA: Diagnosis not present

## 2024-01-31 DIAGNOSIS — F4323 Adjustment disorder with mixed anxiety and depressed mood: Secondary | ICD-10-CM | POA: Diagnosis not present

## 2024-03-07 ENCOUNTER — Other Ambulatory Visit: Payer: Self-pay

## 2024-03-07 ENCOUNTER — Ambulatory Visit
Admission: EM | Admit: 2024-03-07 | Discharge: 2024-03-07 | Disposition: A | Discharge status: Home / Self Care | Attending: Family Medicine | Admitting: Family Medicine

## 2024-03-07 DIAGNOSIS — J029 Acute pharyngitis, unspecified: Secondary | ICD-10-CM | POA: Diagnosis not present

## 2024-03-07 DIAGNOSIS — B349 Viral infection, unspecified: Secondary | ICD-10-CM

## 2024-03-07 LAB — POC COVID19/FLU A&B COMBO
Covid Antigen, POC: NEGATIVE
Influenza A Antigen, POC: NEGATIVE
Influenza B Antigen, POC: NEGATIVE

## 2024-03-07 LAB — POCT RAPID STREP A (OFFICE): Rapid Strep A Screen: NEGATIVE

## 2024-03-07 NOTE — Discharge Instructions (Addendum)

## 2024-03-07 NOTE — ED Triage Notes (Signed)
 Pt c/o sore throat started last night. Pt c/o nasal drainagex2d.

## 2024-03-07 NOTE — ED Provider Notes (Signed)
 [facility]    CSN: [id number] Arrival date & time: 03/07/24  1101      History   Chief Complaint No chief complaint on file.   HPI Patricia [name] is a 23 y.o. female  presents for evaluation of URI symptoms for 2 days. Patient reports associated symptoms of sore throat with runny nose. Denies N/V/D, cough, fevers, ear pain, body aches, shortness of breath. Patient does have a hx of asthma.  Denies wheezing or shortness of breath.  Has not needed to use an inhaler since symptom onset.  Patient is an active smoker.   Reports no known sick contacts.  Pt has taken nothing OTC for symptoms. Pt has no other concerns at this time.   HPI  Past Medical History:  Diagnosis Date   Asthma    SAB (spontaneous abortion) 03/20/2022    Patient Active Problem List   Diagnosis Date Noted   SVD (spontaneous vaginal delivery) 04/17/2023   Pregnancy induced hypertension 04/14/2023   Migraine without aura and without status migrainosus, not intractable 03/20/2022    Past Surgical History:  Procedure Laterality Date   TONSILLECTOMY     TYMPANOSTOMY TUBE PLACEMENT      OB History     Gravida  2   Para  1   Term  1   Preterm      AB  1   Living  1      SAB  1   IAB      Ectopic      Multiple  0   Live Births  1            Home Medications    Prior to Admission medications   Medication Sig Start Date End Date Taking? Authorizing Provider  acetaminophen  (TYLENOL ) 325 MG tablet Take 2 tablets (650 mg total) by mouth every 4 (four) hours as needed (for pain scale < 4). 04/17/23   Grice, Vivian B, CNM  albuterol  (PROVENTIL ) (2.5 MG/3ML) 0.083% nebulizer solution Take 3 mLs (2.5 mg total) by nebulization every 6 (six) hours as needed for wheezing or shortness of breath. 02/21/21   Cottie Donnice PARAS, MD  albuterol  (VENTOLIN  HFA) [phone] Base) MCG/ACT inhaler Inhale 1-2 puffs into the lungs every 6 (six) hours as needed for wheezing or shortness of breath.  04/15/21   Eudelia Maude SAUNDERS, PA-C  CETIRIZINE  HCL PO Take by mouth.    [provider]  Etonogestrel (NEXPLANON [place]) Inject into the skin.    [provider]  furosemide  (LASIX ) 20 MG tablet Take 1 tablet (20 mg total) by mouth daily. 04/19/23   Leveque, Alyssa, MD  ibuprofen  (ADVIL ) 600 MG tablet Take 1 tablet (600 mg total) by mouth every 6 (six) hours as needed. 12/29/23   Andra Corean BROCKS, PA-C  NIFEdipine  (ADALAT  CC) 30 MG 24 hr tablet Take 1 tablet (30 mg total) by mouth daily. 04/19/23   Leveque, Alyssa, MD  loratadine  (CLARITIN ) 10 MG tablet Take 1 tablet (10 mg total) by mouth daily. Patient not taking: No sig reported 12/07/11 06/11/20  Towana Dedra CROME, NP    Family History Family History  Problem Relation Age of Onset   Kidney Stones Mother    Asthma Father     Social History Social History   Tobacco Use   Smoking status: Former    Types: Cigars    Passive exposure: Current   Smokeless tobacco: Never   Tobacco comments:    Black and mild every  day  Vaping Use   Vaping status: Every Day   Substances: Nicotine  Substance Use Topics   Alcohol use: Yes    Comment: occasional   Drug use: Yes    Types: Marijuana    Comment: occasionally     Allergies   Patient has no known allergies.   Review of Systems Review of Systems  HENT:  Positive for rhinorrhea and sore throat.      Physical Exam Triage Vital Signs ED Triage Vitals  Encounter Vitals Group     BP 03/07/24 1134 121/83     Girls Systolic BP Percentile --      Girls Diastolic BP Percentile --      Boys Systolic BP Percentile --      Boys Diastolic BP Percentile --      Pulse Rate 03/07/24 1134 84     Resp 03/07/24 1134 17     Temp 03/07/24 1134 98.5 F (36.9 C)     Temp Source 03/07/24 1134 Oral     SpO2 03/07/24 1134 98 %     Weight --      Height --      Head Circumference --      Peak Flow --      Pain Score 03/07/24 1132 6     Pain Loc --      Pain Education --       Exclude from Growth Chart --    No data found.  Updated Vital Signs BP 121/83   Pulse 84   Temp 98.5 F (36.9 C) (Oral)   Resp 17   SpO2 98%   Breastfeeding No   Visual Acuity Right Eye Distance:   Left Eye Distance:   Bilateral Distance:    Right Eye Near:   Left Eye Near:    Bilateral Near:     Physical Exam Vitals and nursing note reviewed.  Constitutional:      General: She is not in acute distress.    Appearance: She is well-developed. She is not ill-appearing.  HENT:     Head: Normocephalic and atraumatic.     Right Ear: Tympanic membrane and ear canal normal.     Left Ear: Tympanic membrane and ear canal normal.     Nose: No congestion or rhinorrhea.     Mouth/Throat:     Mouth: Mucous membranes are moist.     Pharynx: Oropharynx is clear. Uvula midline. Posterior oropharyngeal erythema present.     Tonsils: No tonsillar exudate or tonsillar abscesses.  Eyes:     Conjunctiva/sclera: Conjunctivae normal.     Pupils: Pupils are equal, round, and reactive to light.  Cardiovascular:     Rate and Rhythm: Normal rate and regular rhythm.     Heart sounds: Normal heart sounds.  Pulmonary:     Effort: Pulmonary effort is normal.     Breath sounds: Normal breath sounds.  Musculoskeletal:     Cervical back: Normal range of motion and neck supple.  Lymphadenopathy:     Cervical: No cervical adenopathy.  Skin:    General: Skin is warm and dry.  Neurological:     General: No focal deficit present.     Mental Status: She is alert and oriented to person, place, and time.  Psychiatric:        Mood and Affect: Mood normal.        Behavior: Behavior normal.      UC Treatments / Results  Labs (all labs ordered  are listed, but only abnormal results are displayed) Labs Reviewed  POCT RAPID STREP A (OFFICE)  POC COVID19/FLU A&B COMBO    EKG   Radiology No results found.  Procedures Procedures (including critical care time)  Medications Ordered in  UC Medications - No data to display  Initial Impression / Assessment and Plan / UC Course  I have reviewed the triage vital signs and the nursing notes.  Pertinent labs & imaging results that were available during my care of the patient were reviewed by me and considered in my medical decision making (see chart for details).     Reviewed exam and symptoms with patient.  No red flags.  Negative COVID flu and strep throat testing.  Discussed viral illness and symptomatic treatment.  PCP follow-up if symptoms do not improve.  ER precautions reviewed. Final Clinical Impressions(s) / UC Diagnoses   Final diagnoses:  Sore throat  Viral illness     Discharge Instructions      Please treat your symptoms with over the counter cough medication, tylenol  or ibuprofen , humidifier, and rest. Viral illnesses can last 7-14 days. Please follow up with your PCP if your symptoms are not improving. Please go to the ER for any worsening symptoms. This includes but is not limited to fever you can not control with tylenol  or ibuprofen , you are not able to stay hydrated, you have shortness of breath or chest pain.  Thank you for choosing [facility] for your healthcare needs. I hope you feel better soon!      ED Prescriptions   None    PDMP not reviewed this encounter.   Loreda Myla SAUNDERS, NP 03/07/24 [phone]
